# Patient Record
Sex: Male | Born: 1960 | Race: White | Hispanic: No | Marital: Married | Smoking: Never smoker
Health system: Southern US, Community
[De-identification: ages and names within clinical notes are randomized; demographics above are authoritative.]

## PROBLEM LIST (undated history)

## (undated) DIAGNOSIS — D126 Benign neoplasm of colon, unspecified: Secondary | ICD-10-CM

## (undated) DIAGNOSIS — K2210 Ulcer of esophagus without bleeding: Secondary | ICD-10-CM

## (undated) DIAGNOSIS — K222 Esophageal obstruction: Secondary | ICD-10-CM

## (undated) DIAGNOSIS — K449 Diaphragmatic hernia without obstruction or gangrene: Secondary | ICD-10-CM

## (undated) DIAGNOSIS — D869 Sarcoidosis, unspecified: Secondary | ICD-10-CM

## (undated) DIAGNOSIS — K5790 Diverticulosis of intestine, part unspecified, without perforation or abscess without bleeding: Secondary | ICD-10-CM

## (undated) DIAGNOSIS — K219 Gastro-esophageal reflux disease without esophagitis: Secondary | ICD-10-CM

## (undated) DIAGNOSIS — R079 Chest pain, unspecified: Secondary | ICD-10-CM

## (undated) DIAGNOSIS — M1990 Unspecified osteoarthritis, unspecified site: Secondary | ICD-10-CM

## (undated) DIAGNOSIS — I10 Essential (primary) hypertension: Secondary | ICD-10-CM

## (undated) HISTORY — DX: Unspecified osteoarthritis, unspecified site: M19.90

## (undated) HISTORY — DX: Ulcer of esophagus without bleeding: K22.10

## (undated) HISTORY — DX: Diverticulosis of intestine, part unspecified, without perforation or abscess without bleeding: K57.90

## (undated) HISTORY — DX: Esophageal obstruction: K22.2

## (undated) HISTORY — DX: Gastro-esophageal reflux disease without esophagitis: K21.9

## (undated) HISTORY — PX: COLONOSCOPY: SHX174

## (undated) HISTORY — PX: UPPER GASTROINTESTINAL ENDOSCOPY: SHX188

## (undated) HISTORY — DX: Sarcoidosis, unspecified: D86.9

## (undated) HISTORY — DX: Benign neoplasm of colon, unspecified: D12.6

## (undated) HISTORY — PX: TONSILLECTOMY AND ADENOIDECTOMY: SUR1326

## (undated) HISTORY — PX: HERNIA REPAIR: SHX51

## (undated) HISTORY — DX: Diaphragmatic hernia without obstruction or gangrene: K44.9

## (undated) HISTORY — DX: Essential (primary) hypertension: I10

---

## 1998-03-25 ENCOUNTER — Ambulatory Visit (HOSPITAL_COMMUNITY): Admission: RE | Admit: 1998-03-25 | Discharge: 1998-03-25 | Payer: Self-pay | Admitting: Family Medicine

## 1998-03-25 ENCOUNTER — Encounter: Payer: Self-pay | Admitting: Family Medicine

## 2001-05-22 ENCOUNTER — Encounter: Admission: RE | Admit: 2001-05-22 | Discharge: 2001-05-22 | Payer: Self-pay | Admitting: General Surgery

## 2001-05-22 ENCOUNTER — Encounter: Payer: Self-pay | Admitting: General Surgery

## 2003-08-29 DIAGNOSIS — D126 Benign neoplasm of colon, unspecified: Secondary | ICD-10-CM

## 2003-08-29 HISTORY — DX: Benign neoplasm of colon, unspecified: D12.6

## 2003-09-05 ENCOUNTER — Encounter: Payer: Self-pay | Admitting: Gastroenterology

## 2005-12-20 ENCOUNTER — Ambulatory Visit: Payer: Self-pay | Admitting: Gastroenterology

## 2005-12-22 ENCOUNTER — Encounter: Payer: Self-pay | Admitting: Gastroenterology

## 2005-12-22 ENCOUNTER — Ambulatory Visit (HOSPITAL_COMMUNITY): Admission: RE | Admit: 2005-12-22 | Discharge: 2005-12-22 | Payer: Self-pay | Admitting: Gastroenterology

## 2005-12-22 ENCOUNTER — Encounter (INDEPENDENT_AMBULATORY_CARE_PROVIDER_SITE_OTHER): Payer: Self-pay | Admitting: *Deleted

## 2005-12-23 ENCOUNTER — Ambulatory Visit: Payer: Self-pay | Admitting: Gastroenterology

## 2006-01-17 ENCOUNTER — Ambulatory Visit (HOSPITAL_COMMUNITY): Admission: RE | Admit: 2006-01-17 | Discharge: 2006-01-17 | Payer: Self-pay | Admitting: Gastroenterology

## 2006-01-17 ENCOUNTER — Encounter: Payer: Self-pay | Admitting: Gastroenterology

## 2006-01-23 ENCOUNTER — Ambulatory Visit: Payer: Self-pay | Admitting: Gastroenterology

## 2006-05-17 ENCOUNTER — Ambulatory Visit: Payer: Self-pay | Admitting: Gastroenterology

## 2009-04-27 ENCOUNTER — Encounter (INDEPENDENT_AMBULATORY_CARE_PROVIDER_SITE_OTHER): Payer: Self-pay | Admitting: *Deleted

## 2009-04-27 ENCOUNTER — Ambulatory Visit: Payer: Self-pay | Admitting: Gastroenterology

## 2009-04-27 DIAGNOSIS — Z860100 Personal history of colon polyps, unspecified: Secondary | ICD-10-CM | POA: Insufficient documentation

## 2009-04-27 DIAGNOSIS — R1319 Other dysphagia: Secondary | ICD-10-CM | POA: Insufficient documentation

## 2009-04-27 DIAGNOSIS — K2100 Gastro-esophageal reflux disease with esophagitis, without bleeding: Secondary | ICD-10-CM | POA: Insufficient documentation

## 2009-05-01 ENCOUNTER — Encounter: Payer: Self-pay | Admitting: Gastroenterology

## 2009-07-07 ENCOUNTER — Ambulatory Visit: Payer: Self-pay | Admitting: Gastroenterology

## 2009-07-08 ENCOUNTER — Encounter: Payer: Self-pay | Admitting: Gastroenterology

## 2009-08-07 ENCOUNTER — Encounter (INDEPENDENT_AMBULATORY_CARE_PROVIDER_SITE_OTHER): Payer: Self-pay | Admitting: *Deleted

## 2009-08-07 ENCOUNTER — Ambulatory Visit: Payer: Self-pay | Admitting: Gastroenterology

## 2009-08-27 ENCOUNTER — Ambulatory Visit: Payer: Self-pay | Admitting: Gastroenterology

## 2009-09-23 ENCOUNTER — Encounter (INDEPENDENT_AMBULATORY_CARE_PROVIDER_SITE_OTHER): Payer: Self-pay | Admitting: *Deleted

## 2009-09-25 ENCOUNTER — Ambulatory Visit: Payer: Self-pay | Admitting: Gastroenterology

## 2009-10-22 ENCOUNTER — Ambulatory Visit: Payer: Self-pay | Admitting: Gastroenterology

## 2009-10-23 ENCOUNTER — Telehealth (INDEPENDENT_AMBULATORY_CARE_PROVIDER_SITE_OTHER): Payer: Self-pay

## 2009-10-23 DIAGNOSIS — K222 Esophageal obstruction: Secondary | ICD-10-CM | POA: Insufficient documentation

## 2009-10-27 ENCOUNTER — Encounter: Payer: Self-pay | Admitting: Gastroenterology

## 2009-10-28 ENCOUNTER — Encounter: Payer: Self-pay | Admitting: Gastroenterology

## 2009-11-25 ENCOUNTER — Ambulatory Visit: Payer: Self-pay | Admitting: Gastroenterology

## 2010-05-25 ENCOUNTER — Encounter: Payer: Self-pay | Admitting: Gastroenterology

## 2010-06-29 NOTE — Miscellaneous (Signed)
Summary: RX prevacid  Clinical Lists Changes  Medications: Added new medication of PREVACID SOLUTAB 30 MG TBDP (LANSOPRAZOLE) 1 solutab dissolve under tongue every morning - Signed Rx of PREVACID SOLUTAB 30 MG TBDP (LANSOPRAZOLE) 1 solutab dissolve under tongue every morning;  #34 x 11;  Signed;  Entered by: Sherren Kerns RN;  Authorized by: Meryl Dare MD [facility];  Method used: [id number] [address] [place], [place]  [address], Ph: [phone] or [phone], Fax: [phone] Observations: Added new observation of ALLERGY REV: Done (10/22/2009 15:48)    Prescriptions: PREVACID SOLUTAB 30 MG TBDP (LANSOPRAZOLE) 1 solutab dissolve under tongue every morning  #34 x 11   Entered by:   Sherren Kerns RN   Authorized by:   Meryl Dare MD [facility]   Signed by:   Sherren Kerns RN on 10/22/2009   Method used:   [facility]        [phone][facility] (retail)       [address]       [place], [place]  [address]       Ph: [phone] or [phone]       Fax: [phone]   RxID:   [id number]

## 2010-06-29 NOTE — Procedures (Signed)
Summary: Upper Endoscopy w/DIL  Patient: [name] Note: All result statuses are Final unless otherwise noted.  Tests: (1) Upper Endoscopy w/DIL (UED)  UED Upper Endoscopy w/DIL                             DONE     [facility]     [address]     [place], [place]  [address]           ENDOSCOPY PROCEDURE REPORT           PATIENT:  Alexander, [name]  MR#:  [id number]     BIRTHDATE:  [birth year], 48 yrs. old  GENDER:  male           ENDOSCOPIST:  Judie Petit T. Russella Dar, MD, [facility]           PROCEDURE DATE:  07/07/2009     PROCEDURE:  EGD with dilatation over guidewire, and with biopsy     ASA CLASS:  Class II     INDICATIONS:  1) dysphagia           MEDICATIONS:  There was residual sedation effect present from     prior procedure, Fentanyl 25 mcg IV, Versed 4 mg IV     TOPICAL ANESTHETIC:  Exactacain Spray           DESCRIPTION OF PROCEDURE:   After the risks benefits and     alternatives of the procedure were thoroughly explained, informed     consent was obtained.  The LB-GIF-H180 [id number] endoscope was     introduced through the mouth and advanced to the second portion of     the duodenum, without limitations.  The instrument was slowly     withdrawn as the mucosa was carefully examined.     <<PROCEDUREIMAGES>>           A stricture was found in the distal esophagus. It was     circumferential and benign appearing. It was 9 mm in size.     Multiple biopsies were obtained and sent to pathology. The     endoscope could not easily pass the stricture and a dilation was     performed prior to evaluation of the stomach and duodenum. The     endoscope passed the stricture without difficulty after the     dilation. The stomach was entered and closely examined. The     pylorus, antrum, angularis, and lesser curvature were well     visualized, including a retroflexed view of the cardia and fundus.     The stomach wall was normally distensable. The scope passed easily     through the  pylorus into the duodenum. The duodenal bulb was     normal in appearance, as was the postbulbar duodenum. A 3 cm     hiatal hernia was found. Dilation was then performed at the distal     esophagus:           1) Dilator:  Savary over guidewire  Size:  13 mm     Resistance:  moderate  Heme:  yes           COMPLICATIONS:  None           ENDOSCOPIC IMPRESSION:     1) Stricture in the distal esophagus     2) Hiatal hernia           RECOMMENDATIONS:     1)  await pathology results     2) anti-reflux regimen     3) continue PPI long term     4) post dilation instructions     5) Repeat EGD with Savary in 3-4 weeks           Vici Novick T. Russella Dar, MD, [facility]           CC:  Holley Bouche, M.D.           n.     eSIGNED:   Venita Lick. Dakota Stangl at 07/07/2009 11:57 AM           [name], [id number]  Note: An exclamation mark (!) indicates a result that was not dispersed into the flowsheet. Document Creation Date: 07/07/2009 11:58 AM _______________________________________________________________________  (1) Order result status: Final Collection or observation date-time: 07/07/2009 11:50 Requested date-time:  Receipt date-time:  Reported date-time:  Referring Physician:   Ordering Physician: Claudette Head [phone]) Specimen Source:  Source: [name] Order Number: [id number] Lab site:

## 2010-06-29 NOTE — Assessment & Plan Note (Signed)
Summary: consult before w ecl difficulty swallowing./..em   History of Present Illness Visit Type: Initial Visit Primary GI MD: Elie Goody [facility] Primary Provider: Holley Bouche, MD Chief Complaint: Patient c/o recurrent dysphagia to solid foods. Patient is also due for a recall colonoscopy. History of Present Illness:   [name] returns today for recurrent problems with solid food dysphagia. He has history of an esophageal stricture dilated twice in 2007. Erosive esophagitis was noted. He was recommended to stay on a proton pump inhibitor, long-term, but has not been on one for the past 2 years. He has occasional heartburn, but has significant problems with solid food dysphagia for the past 2 months and has had to induce vomiting on a few occasions. He is also due for followup of adenomatous colon polyps. He has no colorectal symptoms.   GI Review of Systems    Reports dysphagia with solids and  heartburn.      Denies abdominal pain, acid reflux, belching, bloating, chest pain, dysphagia with liquids, loss of appetite, nausea, vomiting, vomiting blood, weight loss, and  weight gain.        Denies anal fissure, black tarry stools, change in bowel habit, constipation, diarrhea, diverticulosis, fecal incontinence, heme positive stool, hemorrhoids, irritable bowel syndrome, jaundice, light color stool, liver problems, rectal bleeding, and  rectal pain. Preventive Screening-Counseling & Management  Alcohol-Tobacco     Smoking Status: never      Drug Use:  no.     Current Medications (verified): 1)  "hypertension Medication" (Unknown Name/dosage) .... Take 1 Tablet By Mouth Once A Day  Allergies (verified): No Known Drug Allergies  Past History:  Past Medical History: Sarcoidosis Esophageal Stricture 2007 Erosive esophagitis. 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  Alcohol Use - no Daily Caffeine Use Illicit Drug Use - no Smoking Status:  never Drug Use:  no  Review of Systems       The pertinent positives and negatives are noted as above and in the HPI. All other ROS were reviewed and were negative.   Vital Signs:  Patient profile:   50 year old male Height:      74 inches Weight:      218.25 pounds BMI:     28.12 BSA:     2.26 Pulse rate:   88 / minute Pulse rhythm:   regular BP sitting:   110 / 70  (right arm)  Vitals Entered By: Hortense Ramal CMA Duncan Dull) (April 27, 2009 2:44 PM)  Physical Exam  General:  Well developed, well nourished, no acute distress. Head:  Normocephalic and atraumatic. Eyes:  PERRLA, no icterus. Ears:  Normal auditory acuity. Mouth:  No deformity or lesions, dentition normal. Lungs:  Clear throughout to auscultation. Heart:  Regular rate and rhythm; no murmurs, rubs,  or bruits. Abdomen:  Soft, nontender and nondistended. No masses, hepatosplenomegaly or hernias noted. Normal bowel sounds. Rectal:  deferred until time of colonoscopy.   Msk:  Symmetrical with no gross deformities. Normal posture. Pulses:  Normal pulses noted. Extremities:  No clubbing, cyanosis, edema or deformities noted. Neurologic:  Alert and  oriented x4;  grossly normal neurologically. Cervical Nodes:  No significant cervical adenopathy. Inguinal Nodes:  No significant inguinal adenopathy. Psych:  Alert and cooperative. Normal mood and affect.   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 I reiterated the need for long-term proton pump inhibitor therapy to decrease the chance of a recurrent peptic stricture. Begin Prevacid  SolTab 30 mg q.a.m. long term with standard antireflux measures. Patient requests a medication that is chewable or dissolves in his mouth.  The risks, benefits and alternatives to endoscopy with possible biopsy and possible dilation were discussed with the patient and they consent to proceed. The procedure will be scheduled electively. Orders: Colon/Endo (Colon/Endo)  Problem # 2:  COLONIC POLYPS, HX OF (ICD-V12.72) Personal history of tubulovillous adenomatous colon polyps due for surveillance colonoscopy. Orders: Colon/Endo (Colon/Endo)  Patient Instructions: 1)  Colonoscopy brochure given.  2)  Conscious Sedation brochure given.  3)  Upper Endoscopy brochure given.  4)  Avoid foods high in acid content ( tomatoes, citrus juices, spicy foods) . Avoid eating within 3 to 4 hours of lying down or before exercising. Do not over eat; try smaller more frequent meals. Elevate head of bed four inches when sleeping.  5)  Copy sent to : Holley Bouche, MD 6)  The medication list was reviewed and reconciled.  All changed / newly prescribed medications were explained.  A complete medication list was provided to the patient / caregiver.  Prescriptions: PREVACID SOLUTAB 30 MG TBDP (LANSOPRAZOLE) one tablet by mouth dissolved under tongue once daily  #34 x 11   Entered by:   Christie Nottingham CMA (AAMA)   Authorized by:   Meryl Dare MD [facility]   Signed by:   Meryl Dare MD FACG on 04/27/2009   Method used:   [facility]        [phone] ([facility]ail[facility]       [address]       [place], [place]  [address]       Ph: [phone] or [phone]       Fax: [phone]   RxID:   [id number] MOVIPREP 100 GM  SOLR (PEG-KCL-NACL-NASULF-NA ASC-C) As per prep instructions.  #1 x 0   Entered by:   Christie Nottingham CMA (AAMA)   Authorized by:   Meryl Dare MD [facility]   Signed by:   Meryl Dare MD FACG on 04/27/2009   Method used:   [facility] to        [facility] [phone][facility]  (retail)       [address]       [place], [place]  [address]       Ph: [phone] or [phone]       Fax: [phone]   RxID:   [id number]

## 2010-06-29 NOTE — Procedures (Signed)
Summary: Colonoscopy  Patient: Alexander [name] Note: All result statuses are Final unless otherwise noted.  Tests: (1) Colonoscopy (COL)   COL Colonoscopy           DONE     [facility]     [address]     [place], [place]  [address]           COLONOSCOPY PROCEDURE REPORT           PATIENT:  [name], Alexander  MR#:  [id number]     BIRTHDATE:  1960/07/24, 48 yrs. old  GENDER:  male           ENDOSCOPIST:  Judie Petit T. Russella Dar, MD, [facility]           PROCEDURE DATE:  07/07/2009     PROCEDURE:  Colonoscopy with biopsy and snare polypectomy     ASA CLASS:  Class II     INDICATIONS:  1) follow-up of polyp, tubulovillous adenomatous     polyp, 08/2003.           MEDICATIONS:   Fentanyl 100 mcg IV, Versed 8 mg IV           DESCRIPTION OF PROCEDURE:   After the risks benefits and     alternatives of the procedure were thoroughly explained, informed     consent was obtained.  Digital rectal exam was performed and     revealed no abnormalities.   The LB PCF-H180AL [id number] endoscope     was introduced through the anus and advanced to the cecum, which     was identified by both the appendix and ileocecal valve, without     limitations.  The quality of the prep was good, using MoviPrep.     The instrument was then slowly withdrawn as the colon was fully     examined.     <<PROCEDUREIMAGES>>           FINDINGS:  A sessile polyp was found in the mid transverse colon.     It was 7 mm in size. Polyp was snared, then cauterized with     monopolar cautery. Retrieval was successful. A sessile polyp was     found in the mid transverse colon. It was 4 mm in size. Polyp was     snared without cautery. Retrieval was successful. Two polyps were     found in the sigmoid colon. They were 3 mm in size. The polyps     were removed using cold biopsy forceps.  Moderate diverticulosis     was found in the sigmoid colon. This was otherwise a normal     examination of the colon. Retroflexed views in the rectum  revealed     no abnormalities. The time to cecum =  1.75  minutes. The scope     was then withdrawn (time =  13  min) from the patient and the     procedure completed.           COMPLICATIONS:  None           ENDOSCOPIC IMPRESSION:     1) 7 mm sessile polyp in the mid transverse colon     2) 4 mm sessile polyp in the mid transverse colon     3) 3 mm Two polyps in the sigmoid colon     4) Moderate diverticulosis in the sigmoid colon           RECOMMENDATIONS:     1) No  aspirin or NSAID's for 2 weeks     2) Await pathology results     3) High fiber diet     4) Repeat Colonoscopy in 3 years.           Venita Lick. Russella Dar, MD, [facility]           CC: Holley Bouche, MD           n.     Rosalie DoctorVenita Lick. Denisia Harpole at 07/07/2009 11:39 AM           [name], [id number]  Note: An exclamation mark (!) indicates a result that was not dispersed into the flowsheet. Document Creation Date: 07/07/2009 11:40 AM _______________________________________________________________________  (1) Order result status: Final Collection or observation date-time: 07/07/2009 11:35 Requested date-time:  Receipt date-time:  Reported date-time:  Referring Physician:   Ordering Physician: Claudette Head [phone]) Specimen Source:  Source: [name] Order Number: [id number] Lab site:   Appended Document: Colonoscopy     Procedures Next Due Date:    Colonoscopy: 06/2012

## 2010-06-29 NOTE — Letter (Signed)
Summary: Patient [facility] Biopsy Results  [facility]  [address] [place], [place] [address]   Phone: [phone]  Fax: [phone]        July 08, 2009 MRN: [id number]    [facility] [address] [place], [place]  [address]    Dear Alexander [name],  I am pleased to inform you that the biopsies taken during your recent endoscopic examination did not show any evidence of cancer upon pathologic examination. The stricture biopsies were unremarkable.  Additional information/recommendations: Continue with the treatment plan as outlined on the day of your      exam.  You should have a repeat endoscopic examination for this problem              in 3 weeks.  Please call us if you are having persistent problems or have questions about your condition that have not been fully answered at this time.  Sincerely,  Meryl Dare MD [facility]  This letter has been electronically signed by your physician.  Appended Document: Patient Notice-Endo Biopsy Results letter mailed 2.11.11

## 2010-06-29 NOTE — Procedures (Signed)
Summary: Upper Endoscopy w/DIL  Patient: [name] Note: All result statuses are Final unless otherwise noted.  Tests: (1) Upper Endoscopy w/DIL (UED)  UED Upper Endoscopy w/DIL                             DONE     [facility]     [address]     [place], [place]  [address]           ENDOSCOPY PROCEDURE REPORT           PATIENT:  Alexander [name], Alexander [name]  MR#:  [id number]     BIRTHDATE:  08-Apr-1961, 48 yrs. old  GENDER:  male           ENDOSCOPIST:  Judie Petit T. Russella Dar, MD, [facility]           PROCEDURE DATE:  08/27/2009     PROCEDURE:  EGD with dilatation over guidewire     ASA CLASS:  Class II     INDICATIONS:  1) dysphagia  2) dilation of esophageal stricture           MEDICATIONS:  Fentanyl 75 mcg IV, Versed 10 mg IV     TOPICAL ANESTHETIC:  Exactacain Spray           DESCRIPTION OF PROCEDURE:   After the risks benefits and     alternatives of the procedure were thoroughly explained, informed     consent was obtained.  The LB-GIF-H180 [id number] endoscope was     introduced through the mouth and advanced to the second portion of     the duodenum, without limitations.  The instrument was slowly     withdrawn as the mucosa was carefully examined.     <<PROCEDUREIMAGES>>           A stricture was found in the distal esophagus. It was     circumferential. It was 11 mm in diameter.  Duodenitis was found     in the bulb of the duodenum. It was erythematous without erosions.     The stomach was entered and closely examined. The pyolrus, antrum,     angularis, and lesser curvature were well visualized, including a     retroflexed view of the cardia and fundus. The stomach wall was     normally distensable. The scope passed easily through the pylorus     into the duodenum.  A hiatal hernia was found in the cardia,     small.  Dilation was then performed at the distal esophagus:           1) Dilator:  Savary over guidewire  Sizes:  13, 14, 15 mm     Resistance:  minimal  Heme:  yes       COMPLICATIONS:  None           ENDOSCOPIC IMPRESSION:     1) 11 mm stricture in the distal esophagus     2) Duodenitis     3) Hiatal hernia           RECOMMENDATIONS:     1) anti-reflux regimen     2) continue PPI     3) endoscopy with Savary in 3-4 weeks           Candas Deemer T. Russella Dar, MD, [facility]           CC:  Holley Bouche, M.D.  n.     eSIGNED:   Deon Ivey T. Aydeen Blume at 08/27/2009 11:05 AM           [name], [id number]  Note: An exclamation mark (!) indicates a result that was not dispersed into the flowsheet. Document Creation Date: 08/27/2009 11:06 AM _______________________________________________________________________  (1) Order result status: Final Collection or observation date-time: 08/27/2009 11:00 Requested date-time:  Receipt date-time:  Reported date-time:  Referring Physician:   Ordering Physician: Claudette Head [phone]) Specimen Source:  Source: [name] Order Number: [id number] Lab site:

## 2010-06-29 NOTE — Procedures (Signed)
Summary: Upper Endoscopy w/DIL  Patient: Alexander [name] Note: All result statuses are Final unless otherwise noted.  Tests: (1) Upper Endoscopy w/DIL (UED)  UED Upper Endoscopy w/DIL                             DONE     [facility]     [address]     [place], [place]  [address]           ENDOSCOPY PROCEDURE REPORT           PATIENT:  Mustafa, Potts  MR#:  [id number]     BIRTHDATE:  November 21, 1960, 48 yrs. old  GENDER:  male     ENDOSCOPIST:  Judie Petit T. Russella Dar, MD, [facility]           PROCEDURE DATE:  10/22/2009     PROCEDURE:  EGD with dilatation over guidewire     ASA CLASS:  Class II     INDICATIONS:  1) stricture  2) dilation of esophageal stricture     MEDICATIONS:  Fentanyl 100 mcg IV, Versed 9 mg IV     TOPICAL ANESTHETIC:  Exactacain Spray     DESCRIPTION OF PROCEDURE:   After the risks benefits and     alternatives of the procedure were thoroughly explained, informed     consent was obtained.  The LB-GIF-H180 [id number] endoscope was     introduced through the mouth and advanced to the second portion of     the duodenum, without limitations.  The instrument was slowly     withdrawn as the mucosa was carefully examined.     <<PROCEDUREIMAGES>>     A stricture was found at the gastroesophageal junction. It was     erosive and circumferential. It was 12 mm in diametet.  Mild     gastritis was found in the antrum. It was erosive and     erythematous.  Duodenitis was found. It was erythematous and     erosive.  The examination was otherwise normal.  A small hiatal     hernia was found. Dilation was then performed at the     gastroesphageal junction:           1) Dilator:  Savary over guidewire  Size:  14mm     Resistance:  none  Heme:  yes, minimal           2) Dilator:  Savary over guidewire  Size:  15mm     Resistance:  minimal  Heme:  yes, minimal           3) Dilator:  Savary over guidewire  Size(s):  16     Resistance:  minimal  Heme:  yes, moderate        COMPLICATIONS:  None           ENDOSCOPIC IMPRESSION:     1) 12 mm stricture at the gastroesophageal junction     2) Mild antral gastritis     3) Duodenitis     4) Hiatal hernia           RECOMMENDATIONS:     1) anti-reflux regimen     2) continue PPI     3) post dilation instructions     4) endoscopy/savary dilation 2-4 weeks           Garlin Batdorf T. Russella Dar, MD, [facility]           CC:  Holley Bouche, M.D.           n.     eSIGNED:   Venita Lick. Nikitha Mode at 10/22/2009 03:30 PM           Teng, Decou, [id number]  Note: An exclamation mark (!) indicates a result that was not dispersed into the flowsheet. Document Creation Date: 10/22/2009 3:31 PM _______________________________________________________________________  (1) Order result status: Final Collection or observation date-time: 10/22/2009 15:23 Requested date-time:  Receipt date-time:  Reported date-time:  Referring Physician:   Ordering Physician: Claudette Head [phone]) Specimen Source:  Source: [name] Order Number: [id number] Lab site:

## 2010-06-29 NOTE — Letter (Signed)
Summary: Patient Notice-Hyperplastic Polyps  [facility]  [address] [place], [place] [address]   Phone: [phone]  Fax: [phone]        July 08, 2009 MRN: [id number]    [facility] [address] [place], [place]  [address]    Dear Mr. Biondo,  I am pleased to inform you that the colon polyp(s) removed during your recent colonoscopy was (were) found to be hyperplastic. These types of polyps are NOT pre-cancerous.  It is my recommendation that you have a repeat colonoscopy examination in 3 years.  Should you develop new or worsening symptoms of abdominal pain, bowel habit changes or bleeding from the rectum or bowels, please schedule an evaluation with either your primary care physician or with me.  Continue treatment plan as outlined the day of your exam.  Please call us if you are having persistent problems or have questions about your condition that have not been fully answered at this time.  Sincerely,  Meryl Dare MD [facility]  his letter has been electronically signed by your physician.  Appended Document: Patient Notice-Hyperplastic Polyps letter mailed 2.11.11

## 2010-06-29 NOTE — Procedures (Signed)
Summary: EGD and biopsy   EGD  Procedure date:  12/22/2005  Findings:      Findings: Stricture:  Location: [facility]    Procedures Next Due Date:    EGD: 01/2006  EGD  Procedure date:  12/22/2005  Findings:      Findings: Stricture:  Location: [facility]    Procedures Next Due Date:    EGD: 01/2006 Patient Name: Alexander [name], Alexander [name] MRN: [id number] Procedure Procedures: Panendoscopy (EGD) CPT: 43235.    with biopsy(s)/brushing(s). CPT: D1846139.    with esophageal dilation. CPT: G9296129.    with fluoroscopy for esoph stricture, PCT: [id number] Personnel: Endoscopist: Venita Lick. Russella Dar, MD, Clementeen Graham.  Exam Location: Exam performed in Endoscopy Suite. Outpatient  Patient Consent: Procedure, Alternatives, Risks and Benefits discussed, consent obtained, from patient. Consent was obtained by the RN.  Indications  Therapeutics: Reason for exam: Esophageal dilation.  Symptoms: Dysphagia.  History  Current Medications: Patient is not currently taking Coumadin.  Pre-Exam Physical: Performed Sep 05, 2003  Cardio-pulmonary exam, HEENT exam, Abdominal exam, Mental status exam WNL.  Comments: Pt. history reviewed/updated, physical exam performed prior to initiation of sedation?Yes Exam Exam Info: Maximum depth of insertion Duodenum, intended Duodenum. Vocal cords not visualized. Gastric retroflexion performed. ASA Classification: II. Tolerance: excellent.  Sedation Meds: Patient assessed and found to be appropriate for moderate (conscious) sedation. Fentanyl 100 mcg. given IV. Versed 10 mg. given IV. Cetacaine Spray 2 sprays given aerosolized.  Monitoring: BP and pulse monitoring done. Oximetry used. Supplemental O2 given  Findings Normal: Proximal Esophagus to Mid Esophagus.  STRICTURE / STENOSIS: Distal Esophagus.  Constriction: partial. Lumen diameter is 9 mm. Biopsy of Stricture/Steno  taken. ICD9: Esophageal Stricture: 530.3. Comment: only able to pass the  stricture post dilation.  - Dilation: Distal Esophagus. Procedure was performed under Fluoroscopy. Savary dilator used, Diameter: 9 mm, No Resistance, Minimal Heme present on extraction. Savary dilator used, Diameter: 10 mm, Minimal Resistance, Minimal Heme present on extraction. Savary dilator used, Diameter: 11 mm, Minimal Resistance, Minimal Heme present on extraction. Savary dilator used, Diameter: 12 mm, Minimal Resistance, Minimal Heme present on extraction. Patient tolerance excellent. Outcome: successful.  HIATAL HERNIA: 5 cms. in length. ICD9: Hernia, Hiatal: 553.3. Normal: Fundus to Duodenal 2nd Portion.   Assessment  Diagnoses: 530.3: Esophageal Stricture.  553.3: Hernia, Hiatal.   Events  Unplanned Intervention: No unplanned interventions were required.  Unplanned Events: There were no complications. Plans Instructions: Nothing to eat or drink for 1 hour.  Clear or full liquids: 1 hour. No aspirin or non-steroidal containing medications: 1 week.  Medication(s): Await pathology. PPI: QAM, for indefinitely.   Patient Education: Patient given standard instructions for: Hiatal Hernia. Stenosis / Stricture. Varices.  Disposition: After procedure patient sent to recovery. After recovery patient sent home.  Scheduling: EGD, to [employer]. Russella Dar, MD, Clementeen Graham, savary with fluoro-[facility] around Jan 16, 2006.    This report was created from the original endoscopy report, which was reviewed and signed by the above listed endoscopist.    cc: Holley Bouche, MD       SP Surgical Pathology - STATUS: Final             By: Osie Bond  ,      Perform Date: 26Jul07 00:01  Ordered By: Rica Records Date: 26Jul07 16:49  Facility: [facility]  Department: CPATH  Service Report Text  [facility]   [address]   [place], [place] [address]   [phone]    REPORT OF SURGICAL  PATHOLOGY    Case #: [id number]   Patient Name: Alexander [name], Alexander [name].   Office Chart Number: N/A    MRN: [id number]   Pathologist: Renato Battles, M.D.   DOB/Age 09-18-1960 (Age: 50) Gender: M   Date Taken: 12/22/2005   Date Received: 12/22/2005    FINAL DIAGNOSIS    ***MICROSCOPIC EXAMINATION AND DIAGNOSIS***    DISTAL ESOPHAGUS, BIOPSY: GASTROESOPHAGEAL JUNCTION MUCOSA WITH   MILD INFLAMMATION CONSISTENT WITH GASTROESOPHAGEAL REFLUX. NO   INTESTINAL METAPLASIA, DYSPLASIA OR MALIGNANCY IDENTIFIED.    COMMENT   An Alcian Blue stain is performed to determine the presence of   intestinal metaplasia (goblet cell metaplasia). No intestinal   metaplasia (goblet cell metaplasia) is identified with the Alcian   Blue stain. The control stained appropriately. (MS:mj 12/23/05)    mj   Date Reported: 12/23/2005 Renato Battles, M.D.   *** Electronically Signed Out By MS ***    Clinical information   R/O peptic stricture (as)    specimen(s) obtained   Esophagus, biopsy, distal stricture    Gross Description   Received in formalin are tan, soft tissue fragments that are   submitted in toto. Number: two.   Size: each 0.2 cm SW:mw 12/23/05    mw/

## 2010-06-29 NOTE — Miscellaneous (Signed)
Summary: [facility]  Clinical Lists Changes  Observations: Added new observation of NKA: T (09/25/2009 15:58)

## 2010-06-29 NOTE — Medication Information (Signed)
Summary: Lansoprazole/[employer]  Lansoprazole/[employer]   Imported By: Sherian Rein 11/02/2009 14:34:17  _____________________________________________________________________  External Attachment:    Type:   Image     Comment:   External Document

## 2010-06-29 NOTE — Letter (Signed)
Summary: EGD Instructions  [facility]  [address] [place], [place] [address]   Phone: [phone]  Fax: [phone]       Alexander [name]    1960/12/12    MRN: [id number]       Procedure Day Dorna Bloom: Wednesday 11-25-09     Arrival Time: 12:30      Procedure Time: 1:30     Location of Procedure:                    [facility] (4th Floor)  PREPARATION FOR ENDOSCOPY   On Wednesday 11-25-09  THE DAY OF THE PROCEDURE:  1.   No solid foods, milk or milk products are allowed after midnight the night before your procedure.  2.   Do not drink anything colored red or purple.  Avoid juices with pulp.  No orange juice.  3.  You may drink clear liquids until 11:30 , which is 2 hours before your procedure.                                                                                                CLEAR LIQUIDS INCLUDE: Water Jello Ice Popsicles Tea (sugar ok, no milk/cream) Powdered fruit flavored drinks Coffee (sugar ok, no milk/cream) Gatorade Juice: apple, white grape, white cranberry  Lemonade Clear bullion, consomm, broth Carbonated beverages (any kind) Strained chicken noodle soup Hard Candy   MEDICATION INSTRUCTIONS  Unless otherwise instructed, you should take regular prescription medications with a small sip of water as early as possible the morning of your procedure.  Diabetic patients - see separate instructions.           OTHER INSTRUCTIONS  You will need a responsible adult at least 50 years of age to accompany you and drive you home.   This person must remain in the waiting room during your procedure.  Wear loose fitting clothing that is easily removed.  Leave jewelry and other valuables at home.  However, you may wish to bring a book to read or an iPod/MP3 player to listen to music as you wait for your procedure to start.  Remove all body piercing jewelry and leave at home.  Total time from sign-in until discharge is  approximately 2-3 hours.  You should go home directly after your procedure and rest.  You can resume normal activities the day after your procedure.  The day of your procedure you should not:   Drive   Make legal decisions   Operate machinery   Drink alcohol   Return to work  You will receive specific instructions about eating, activities and medications before you leave.    The above instructions have been reviewed and explained to me by   _______________________    I fully understand and can verbalize these instructions _____________________________ Date _________

## 2010-06-29 NOTE — Procedures (Signed)
Summary: Upper Endoscopy w/DIL  Patient: Alexander [name] Note: All result statuses are Final unless otherwise noted.  Tests: (1) Upper Endoscopy w/DIL (UED)  UED Upper Endoscopy w/DIL                             DONE     [facility]     [address]     [place], [place]  [address]           ENDOSCOPY PROCEDURE REPORT           PATIENT:  Alexander, [name]  MR#:  [id number]     BIRTHDATE:  [birth year], 48 yrs. old  GENDER:  male     ENDOSCOPIST:  Judie Petit T. Russella Dar, MD, [facility]           PROCEDURE DATE:  11/25/2009     PROCEDURE:  EGD with dilatation over guidewire     ASA CLASS:  Class II     INDICATIONS:  1) stricture  2) dilation of esophageal stricture     MEDICATIONS:  Fentanyl 75 mcg IV, Versed 8 mg IV     TOPICAL ANESTHETIC:  Exactacain Spray     DESCRIPTION OF PROCEDURE:   After the risks benefits and     alternatives of the procedure were thoroughly explained, informed     consent was obtained.  The [id number] endoscope was     introduced through the mouth and advanced to the second portion of     the duodenum, without limitations.  The instrument was slowly     withdrawn as the mucosa was carefully examined.     <<PROCEDUREIMAGES>>     A stricture was found at the gastroesophageal junction. It was     circumferential and benign appearing. It was 14 mm in diameter.  A     hiatal hernia was found. It was small.  The esophagus was     completely normal in appearance.  The stomach was entered and     closely examined. The pylorus, antrum, angularis, and lesser     curvature were well visualized, including a retroflexed view of     the cardia and fundus. The stomach wall was normally distensable.     The scope passed easily through the pylorus into the duodenum.     The duodenal bulb was normal in appearance, as was the postbulbar     duodenum.  Dilation was then performed at the gastroesphageal     junction           1) Dilator:  Savary over guidewire  Sizes:  15 mm,  16 mm, 17 mm     Resistance:  minimal  Heme:  none on the first two and a trace on     the last dilator           COMPLICATIONS:  None           ENDOSCOPIC IMPRESSION:     1) 14 mm stricture at the gastroesophageal junction     2) Small hiatal hernia           RECOMMENDATIONS:     1) anti-reflux regimen long term     2) continue PPI qam long term     3) post dilation instructions     4) follow-up: GI clinic 12 months           Philipp Callegari T. Russella Dar, MD, Clementeen Graham  CC:  Holley Bouche, M.D.           n.     eSIGNED:   Venita Lick. Danniel Grenz at 11/25/2009 01:55 PM           [place], [place], [id number]  Note: An exclamation mark (!) indicates a result that was not dispersed into the flowsheet. Document Creation Date: 11/25/2009 1:55 PM _______________________________________________________________________  (1) Order result status: Final Collection or observation date-time: 11/25/2009 13:50 Requested date-time:  Receipt date-time:  Reported date-time:  Referring Physician:   Ordering Physician: Claudette Head [phone]) Specimen Source:  Source: [name] Order Number: [id number] Lab site:

## 2010-06-29 NOTE — Procedures (Signed)
Summary: Colonoscopy   Colonoscopy  Procedure date:  09/05/2003  Findings:      Results: Polyp.  Results: Diverticulosis.       Location:  [facility].    Procedures Next Due Date:    Colonoscopy: 08/2006  Colonoscopy  Procedure date:  09/05/2003  Findings:      Results: Polyp.  Results: Diverticulosis.       Location:  [facility].    Procedures Next Due Date:    Colonoscopy: 08/2006 Patient Name: Alexander [name], Alexander [name] MRN: [id number] Procedure Procedures: Colonoscopy CPT: 54098.    with Hot Biopsy(s)CPT: Z451292.    with polypectomy. CPT: A3573898.  Personnel: Endoscopist: Venita Lick. Russella Dar, MD, Clementeen Graham.  Referred By: Arsenio Loader, MD.  Exam Location: Exam performed in Outpatient Clinic. Outpatient  Patient Consent: Procedure, Alternatives, Risks and Benefits discussed, consent obtained, from patient. Consent was obtained by the RN.  Indications  Evaluation of: Positive fecal occult blood test  History  Current Medications: Patient is not currently taking Coumadin.  Pre-Exam Physical: Performed Sep 05, 2003. Entire physical exam was normal.  Exam Exam: Extent of exam reached: Cecum, extent intended: Cecum.  The cecum was identified by appendiceal orifice and IC valve. Colon retroflexion performed. ASA Classification: II. Tolerance: excellent.  Monitoring: Pulse and BP monitoring, Oximetry used. Supplemental O2 given.  Colon Prep Used Miralax for colon prep. Prep results: excellent.  Sedation Meds: Patient assessed and found to be appropriate for moderate (conscious) sedation. Fentanyl 125 mcg. given IV. Versed 12 given IV.  Findings NORMAL EXAM: Hepatic Flexure to Descending Colon.  MULTIPLE POLYPS: Ascending Colon. minimum size 6 mm, maximum size 7 mm. Procedure:  snare with cautery, removed, Polyp retrieved, 2 polyps Polyps sent to pathology. ICD9: Colon Polyps: 211.3.  NORMAL EXAM: Cecum.  - DIVERTICULOSIS: Sigmoid Colon. Not bleeding.  ICD9: Diverticulosis: 562.10.  POLYP: Sigmoid Colon, Maximum size: 4 mm. sessile polyp. Procedure:  hot biopsy, removed, retrieved, Polyp sent to pathology. ICD9: Colon Polyps: 211.3.  POLYP: Rectum, Maximum size: 17 mm. pedunculated polyp. Procedure:  snare with cautery, The polyp was removed piece meal. removed, retrieved, sent to pathology. ICD9: Colon Polyps: 211.3. Comments: located at 10-12 cm from anal verge.   Assessment  Diagnoses: 211.3: Colon Polyps.  562.10: Diverticulosis.   Events  Unplanned Interventions: No intervention was required.  Unplanned Events: There were no complications. Plans  Post Exam Instructions: No aspirin or non-steroidal containing medications: 2 weeks.  Medication Plan: Await pathology. Continue current medications.  Patient Education: Patient given standard instructions for: Polyps. Diverticulosis.  Disposition: After procedure patient sent to recovery. After recovery patient sent home.  Scheduling/Referral: Colonoscopy, to [facility] T. Russella Dar, MD, [facility], around Sep 05, 2006.  Primary Care Provider, to Arsenio Loader, MD,    This report was created from the original endoscopy report, which was reviewed and signed by the above listed endoscopist.    cc: Arsenio Loader, MD

## 2010-06-29 NOTE — Progress Notes (Signed)
Summary: Schedule EGD   Phone Note Outgoing Call Call back at [facility] Phone [phone]   Call placed by: Darcey Nora RN, CGRN,  Oct 23, 2009 8:57 AM Call placed to: Patient Summary of Call: Placed a call to the patient to schedule ESA.  I have asked him to call me back to schedule Initial call taken by: Darcey Nora RN, CGRN,  Oct 23, 2009 8:58 AM  Follow-up for Phone Call        Patient  scheduled for ESA 11-25-09 1:30.  I will mail him instructions.  He reports that the pharmacy said they didn't get rx I will resend prevacid.  Follow-up by: Darcey Nora RN, CGRN,  Oct 27, 2009 9:45 AM  New Problems: ESOPHAGEAL STRICTURE (ICD-530.3)   New Problems: ESOPHAGEAL STRICTURE (ICD-530.3) Prescriptions: PREVACID SOLUTAB 30 MG TBDP (LANSOPRAZOLE) 1 solutab dissolve under tongue every morning  #30 x 11   Entered by:   Darcey Nora RN, CGRN   Authorized by:   Meryl Dare MD [facility]   Signed by:   Darcey Nora RN, CGRN on 10/27/2009   Method used:   Electronically to        [facility] [phone]* (retail)       [address]       [place], [place]  [address]       Ph: [phone] or [phone]       Fax: [phone]   RxID:   [id number]

## 2010-06-29 NOTE — Procedures (Signed)
Summary: EGD   EGD  Procedure date:  01/17/2006  Findings:      Findings: Stricture:  Location: [facility]    Procedures Next Due Date:    EGD: 01/2006  EGD  Procedure date:  01/17/2006  Findings:      Findings: Stricture:  Location: [facility]    Procedures Next Due Date:    EGD: 01/2006 Patient Name: Alexander [name], Alexander [name] MRN: [id number] Procedure Procedures: Panendoscopy (EGD) CPT: 43235.    with esophageal dilation. CPT: G9296129.    with fluoroscopy esoph stricture, CPT: 11914-78 Personnel: Endoscopist: Venita Lick. Russella Dar, MD, Clementeen Graham.  Exam Location: Exam performed in Endoscopy Suite. Inpatient-ward  Patient Consent: Procedure, Alternatives, Risks and Benefits discussed, consent obtained, from patient. Consent was obtained by the RN.  Indications  Therapeutics: Reason for exam: Esophageal dilation.  Symptoms: Dysphagia.  History  Current Medications: Patient is not currently taking Coumadin.  Pre-Exam Physical: Performed Jan 17, 2006  Cardio-pulmonary exam, HEENT exam, Abdominal exam, Mental status exam WNL.  Comments: Pt. history reviewed/updated, physical exam performed prior to initiation of sedation?Yes Exam Exam Info: Maximum depth of insertion Duodenum, intended Duodenum. Vocal cords not visualized. Gastric retroflexion performed. ASA Classification: II. Tolerance: excellent.  Sedation Meds: Patient assessed and found to be appropriate for moderate (conscious) sedation. Fentanyl 100 mcg. given IV. Versed 10 mg. given IV. Cetacaine Spray 2 sprays given aerosolized.  Monitoring: BP and pulse monitoring done. Oximetry used. Supplemental O2 given  Fluoroscopy: Fluoroscopy was used.  Findings Normal: Proximal Esophagus to Mid Esophagus.  STRICTURE / STENOSIS: Stricture in Distal Esophagus.  Lumen diameter is 11 mm. ICD9: Esophageal Stricture: 530.3.  - Dilation: Mid Esophagus. Procedure was performed under Fluoroscopy. Savary dilator  used, Diameter: 12 mm, Minimal Resistance, No Heme present on extraction. Savary dilator used, Diameter: 12.8 mm, Minimal Resistance, No Heme present on extraction. Savary dilator used, Diameter: 14 mm, Minimal Resistance, Minimal Heme present on extraction. Patient tolerance excellent. Outcome: successful.  HIATAL HERNIA: Regular, 5 cms. in length. ICD9: Hernia, Hiatal: 553.3. Normal: Fundus to Duodenal 2nd Portion.   Assessment  Diagnoses: 553.3: Hernia, Hiatal.  530.3: Esophageal Stricture.   Events  Unplanned Intervention: No unplanned interventions were required.  Unplanned Events: There were no complications. Plans Instructions: Nothing to eat or drink for 1 hour.  Clear or full liquids: 1 hour.  Medication(s): PPI: QAM, for indefinitely.   Patient Education: Patient given standard instructions for: Hiatal Hernia. Reflux. Stenosis / Stricture.  Disposition: After procedure patient sent to recovery. After recovery patient sent home.  Scheduling: EGD, to [employer]. Russella Dar, MD, [facility], with Savary dilation, no fluoro around Feb 07, 2006.    This report was created from the original endoscopy report, which was reviewed and signed by the above listed endoscopist.    cc: Holley Bouche, MD

## 2010-06-29 NOTE — Miscellaneous (Signed)
Summary: [facility]  Clinical Lists Changes  Observations: Added new observation of NKA: T (08/07/2009 8:22)

## 2010-06-29 NOTE — Letter (Signed)
Summary: EGD Instructions  [facility]  [address] [place], [place] [address]   Phone: [phone]  Fax: [phone]       Alexander [name]    [birth year]    MRN: [id number]       Procedure Day /Date:  Friday 10/09/2009     Arrival Time: 10:00 am     Procedure Time: 11:00 am     Location of Procedure:                    _ x _ [facility] (4th Floor)   PREPARATION FOR ENDOSCOPY   On Friday 5/13 THE DAY OF THE PROCEDURE:  1.   No solid foods, milk or milk products are allowed after midnight the night before your procedure.  2.   Do not drink anything colored red or purple.  Avoid juices with pulp.  No orange juice.  3.  You may drink clear liquids until 9:00 am, which is 2 hours before your procedure.                                                                                                CLEAR LIQUIDS INCLUDE: Water Jello Ice Popsicles Tea (sugar ok, no milk/cream) Powdered fruit flavored drinks Coffee (sugar ok, no milk/cream) Gatorade Juice: apple, white grape, white cranberry  Lemonade Clear bullion, consomm, broth Carbonated beverages (any kind) Strained chicken noodle soup Hard Candy   MEDICATION INSTRUCTIONS  Unless otherwise instructed, you should take regular prescription medications with a small sip of water as early as possible the morning of your procedure.    Additional medication instructions:  Do not take Lisinopril/HCTZ day of procedure.             OTHER INSTRUCTIONS  You will need a responsible adult at least 50 years of age to accompany you and drive you home.   This person must remain in the waiting room during your procedure.  Wear loose fitting clothing that is easily removed.  Leave jewelry and other valuables at home.  However, you may wish to bring a book to read or an iPod/MP3 player to listen to music as you wait for your procedure to start.  Remove all body piercing jewelry and leave at  home.  Total time from sign-in until discharge is approximately 2-3 hours.  You should go home directly after your procedure and rest.  You can resume normal activities the day after your procedure.  The day of your procedure you should not:   Drive   Make legal decisions   Operate machinery   Drink alcohol   Return to work  You will receive specific instructions about eating, activities and medications before you leave.    The above instructions have been reviewed and explained to me by   Ezra Sites RN  September 25, 2009 4:29 PM     I fully understand and can verbalize these instructions _____________________________ Date _________

## 2010-06-29 NOTE — Medication Information (Signed)
Summary: Lansoprazole approved til2/3/11/[employer]  Lansoprazole approved til2/3/11/[employer]   Imported By: Lester [place] 05/06/2009 08:07:06  _____________________________________________________________________  External Attachment:    Type:   Image     Comment:   External Document

## 2010-06-29 NOTE — Letter (Signed)
Summary: EGD Instructions  [facility]  [address] [place], [place] [address]   Phone: [phone]  Fax: [phone]       Alexander [name]    1960-07-29    MRN: [id number]       Procedure Day /Date: Thursday 08/27/2009     Arrival Time:  9:30 am     Procedure Time: 10:30 am     Location of Procedure:                    _x  _ [facility] (4th Floor)   PREPARATION FOR ENDOSCOPY   On Thursday 3/31 THE DAY OF THE PROCEDURE:  1.   No solid foods, milk or milk products are allowed after midnight the night before your procedure.  2.   Do not drink anything colored red or purple.  Avoid juices with pulp.  No orange juice.  3.  You may drink clear liquids until 8:30 am, which is 2 hours before your procedure.                                                                                                CLEAR LIQUIDS INCLUDE: Water Jello Ice Popsicles Tea (sugar ok, no milk/cream) Powdered fruit flavored drinks Coffee (sugar ok, no milk/cream) Gatorade Juice: apple, white grape, white cranberry  Lemonade Clear bullion, consomm, broth Carbonated beverages (any kind) Strained chicken noodle soup Hard Candy   MEDICATION INSTRUCTIONS  Unless otherwise instructed, you should take regular prescription medications with a small sip of water as early as possible the morning of your procedure.      Additional medication instructions:  Do not take Lisinopril/HCTZ morning of procedure.             OTHER INSTRUCTIONS  You will need a responsible adult at least 50 years of age to accompany you and drive you home.   This person must remain in the waiting room during your procedure.  Wear loose fitting clothing that is easily removed.  Leave jewelry and other valuables at home.  However, you may wish to bring a book to read or an iPod/MP3 player to listen to music as you wait for your procedure to start.  Remove all body piercing jewelry and leave  at home.  Total time from sign-in until discharge is approximately 2-3 hours.  You should go home directly after your procedure and rest.  You can resume normal activities the day after your procedure.  The day of your procedure you should not:   Drive   Make legal decisions   Operate machinery   Drink alcohol   Return to work  You will receive specific instructions about eating, activities and medications before you leave.    The above instructions have been reviewed and explained to me by   Ezra Sites RN  August 07, 2009 8:44 AM    I fully understand and can verbalize these instructions _____________________________ Date _________

## 2010-06-29 NOTE — Letter (Signed)
Summary: [facility] Instructions  [facility]  [address] [place], [place] [address]   Phone: [phone]  Fax: [phone]       TRACEY STEWART    09/21/1960    MRN: [id number]        Procedure Day /Date:Tuesday January 11th, 2011     Arrival Time: 8:00am     Procedure Time: 9:00am     Location of Procedure:                    _ x_  [facility] (4th Floor)                        PREPARATION FOR COLONOSCOPY WITH MOVIPREP   Starting 5 days prior to your procedure 06/04/09  do not eat nuts, seeds, popcorn, corn, beans, peas,  salads, or any raw vegetables.  Do not take any fiber supplements (e.g. Metamucil, Citrucel, and Benefiber).  THE DAY BEFORE YOUR PROCEDURE         DATE:  06/08/09   DAY:  Monday   1.  Drink clear liquids the entire day-NO SOLID FOOD  2.  Do not drink anything colored red or purple.  Avoid juices with pulp.  No orange juice.  3.  Drink at least 64 oz. (8 glasses) of fluid/clear liquids during the day to prevent dehydration and help the prep work efficiently.  CLEAR LIQUIDS INCLUDE: Water Jello Ice Popsicles Tea (sugar ok, no milk/cream) Powdered fruit flavored drinks Coffee (sugar ok, no milk/cream) Gatorade Juice: apple, white grape, white cranberry  Lemonade Clear bullion, consomm, broth Carbonated beverages (any kind) Strained chicken noodle soup Hard Candy                             4.  In the morning, mix first dose of MoviPrep solution:    Empty 1 Pouch A and 1 Pouch B into the disposable container    Add lukewarm drinking water to the top line of the container. Mix to dissolve    Refrigerate (mixed solution should be used within 24 hrs)  5.  Begin drinking the prep at 5:00 p.m. The MoviPrep container is divided by 4 marks.   Every 15 minutes drink the solution down to the next mark (approximately 8 oz) until the full liter is complete.   6.  Follow completed prep with 16 oz of clear liquid of your choice  (Nothing red or purple).  Continue to drink clear liquids until bedtime.  7.  Before going to bed, mix second dose of MoviPrep solution:    Empty 1 Pouch A and 1 Pouch B into the disposable container    Add lukewarm drinking water to the top line of the container. Mix to dissolve    Refrigerate  THE DAY OF YOUR PROCEDURE      DATE:  06/09/09  DAY:  Tuesday  Beginning at 4:00 a.m. (5 hours before procedure):         1. Every 15 minutes, drink the solution down to the next mark (approx 8 oz) until the full liter is complete.  2. Follow completed prep with 16 oz. of clear liquid of your choice.    3. You may drink clear liquids until 7:00am  (2 HOURS BEFORE PROCEDURE).   MEDICATION INSTRUCTIONS  Unless otherwise instructed, you should take regular prescription medications with a small sip of water  as early as possible the morning of your procedure.        OTHER INSTRUCTIONS  You will need a responsible adult at least 50 years of age to accompany you and drive you home.   This person must remain in the waiting room during your procedure.  Wear loose fitting clothing that is easily removed.  Leave jewelry and other valuables at home.  However, you may wish to bring a book to read or  an iPod/MP3 player to listen to music as you wait for your procedure to start.  Remove all body piercing jewelry and leave at home.  Total time from sign-in until discharge is approximately 2-3 hours.  You should go home directly after your procedure and rest.  You can resume normal activities the  day after your procedure.  The day of your procedure you should not:   Drive   Make legal decisions   Operate machinery   Drink alcohol   Return to work  You will receive specific instructions about eating, activities and medications before you leave.    The above instructions have been reviewed and explained to me by   Estill Bamberg.     I fully understand and can verbalize these  instructions _____________________________ Date _________

## 2010-06-29 NOTE — Procedures (Signed)
Summary: EGD   EGD  Procedure date:  05/17/2006  Findings:      Findings: Esophagitis  Findings: Stricture:  Location: [facility]    Procedures Next Due Date:    EGD: 06/2006 Patient Name: Alexander [name], Alexander [name] MRN: [id number] Procedure Procedures: Panendoscopy (EGD) CPT: 43235.    with esophageal dilation. CPT: G9296129.  Personnel: Endoscopist: Venita Lick. Russella Dar, MD, Clementeen Graham.  Exam Location: Exam performed in Outpatient Clinic. Outpatient  Patient Consent: Procedure, Alternatives, Risks and Benefits discussed, consent obtained, from patient. Consent was obtained by the RN.  Indications  Therapeutics: Reason for exam: Esophageal dilation.  Symptoms: Dysphagia.  History  Current Medications: Patient is not currently taking Coumadin.  Pre-Exam Physical: Performed Jan 17, 2006  Cardio-pulmonary exam, HEENT exam, Abdominal exam, Mental status exam WNL.  Comments: Pt. history reviewed/updated, physical exam performed prior to initiation of sedation?Yes Exam Exam Info: Maximum depth of insertion Duodenum, intended Duodenum. Vocal cords not visualized. Gastric retroflexion performed. ASA Classification: II. Tolerance: excellent.  Sedation Meds: Patient assessed and found to be appropriate for moderate (conscious) sedation. Fentanyl 100 mcg. given IV. Versed 10 mg. given IV. Cetacaine Spray 2 sprays given aerosolized.  Monitoring: BP and pulse monitoring done. Oximetry used. Supplemental O2 given  Findings Normal: Proximal Esophagus to Mid Esophagus.  ESOPHAGEAL INFLAMMATION: Severity is moderate, erosions present.  [place] Classification: Grade B. ICD9: Esophagitis, Reflux: 530.11.  STRICTURE / STENOSIS: Stricture in Distal Esophagus.  Constriction: partial. ICD9: Esophageal Stricture: 530.3.  - Dilation: Mid Esophagus. Savary dilator used, Diameter: 13 mm, Minimal Resistance, No Heme present on extraction. Savary dilator used, Diameter: 14 mm, Minimal  Resistance, Minimal Heme present on extraction. Savary dilator used, Diameter: 15 mm, Minimal Resistance, Minimal Heme present on extraction. Patient tolerance excellent. Outcome: successful.  HIATAL HERNIA: Regular, 5 cms. in length. ICD9: Hernia, Hiatal: 553.3. Normal: Fundus to Duodenal 2nd Portion.   Assessment  Diagnoses: 530.3: Esophageal Stricture.  553.3: Hernia, Hiatal.  530.11: Esophagitis, Reflux.   Events  Unplanned Intervention: No unplanned interventions were required.  Unplanned Events: There were no complications. Plans Instructions: Nothing to eat or drink for 1 hour.  Clear or full liquids: 1 hour.  Medication(s): PPI: QAM, for indefinitely.   Patient Education: Patient given standard instructions for: Hiatal Hernia. Reflux. Stenosis / Stricture.  Disposition: After procedure patient sent to recovery. After recovery patient sent home.  Scheduling: EGD, to [employer]. Russella Dar, MD, [facility], continue dilations around Jun 24, 2006.    This report was created from the original endoscopy report, which was reviewed and signed by the above listed endoscopist.    cc: Holley Bouche, MD

## 2010-07-01 NOTE — Medication Information (Signed)
Summary: Approved Lansoprazole / [employer]  Approved Lansoprazole / [employer]   Imported By: Lennie Odor 06/02/2010 15:03:19  _____________________________________________________________________  External Attachment:    Type:   Image     Comment:   External Document

## 2010-07-06 ENCOUNTER — Other Ambulatory Visit: Payer: Self-pay | Admitting: Family Medicine

## 2010-07-06 DIAGNOSIS — R200 Anesthesia of skin: Secondary | ICD-10-CM

## 2010-07-06 DIAGNOSIS — R42 Dizziness and giddiness: Secondary | ICD-10-CM

## 2010-07-07 ENCOUNTER — Ambulatory Visit
Admission: RE | Admit: 2010-07-07 | Discharge: 2010-07-07 | Disposition: A | Discharge status: Home / Self Care | Payer: 59 | Source: Ambulatory Visit | Attending: Family Medicine | Admitting: Family Medicine

## 2010-07-07 DIAGNOSIS — R42 Dizziness and giddiness: Secondary | ICD-10-CM

## 2010-07-07 DIAGNOSIS — R200 Anesthesia of skin: Secondary | ICD-10-CM

## 2010-10-15 NOTE — Assessment & Plan Note (Signed)
[facility]                           GASTROENTEROLOGY OFFICE NOTE   NAME:Alexander [name], Alexander [name]                     MRN:          [id number]  DATE:12/20/2005                            DOB:          29-Aug-1960    REFERRING PHYSICIAN:  Holley Bouche, MD   REASON FOR REFERRAL:  Dysphagia, abnormal upper GI series.   HISTORY OF PRESENT ILLNESS:  [name] is a very nice 50 year old white male  who I have seen previously.  Colonoscopy for Hemoccult positive stool was  performed in April, 2005, which showed adenomatous polyps and tubulovillous  adenomatous polyps.  Recall colonoscopy is planned for April, 2008.  He  relates intermittent problems with GERD since about age 50.  He was  diagnosed with pulmonary sarcoidosis and placed on prednisone for short  term.  He states that his reflux symptoms began around that time.  His  pulmonary sarcoidosis has not been active for years, and he was on no  treatment for it.  He has had intermittent, frequent nocturnal reflux  problems for many years.  The past few years, his symptoms have really been  quite infrequent.  He has had difficulty swallowing pills for many years but  has done fine with solid foods.  Over the past 3-4 months, he has noticed  progressively worsening solid food dysphagia.  He has had multiple episodes  where food feels like it sticks, and he has self-induced vomiting to relieve  a food impaction on multiple occasions.  He has frequent early satiety and  some post prandial belching.  He has infrequent heartburn and regurgitation  mainly occurring at night when he has eaten late.   Upper GI series performed at [facility] on October 28, 2005 showed a  large hiatal hernia measuring about 6-8 cm with a distal esophageal  stricture estimated at 10 mm or less.  A barium tablet was not used, as the  patient states he has a very difficult time swallowing pills.  He has no  weight loss, abdominal  pain, chest pain, melena, hematochezia, nausea,  vomiting, or hematemesis.  No family history of colon cancer, colon polyps,  or inflammatory bowel disease.   PAST MEDICAL HISTORY:  1.  Pulmonary sarcoidosis at age 75, current inactive.  2.  Tubulovillous adenomatous colon polyps.  3.  Hiatal hernia.  4.  Esophageal stricture.   CURRENT MEDICATIONS:  1.  Chewable aspirin p.r.n.  2.  Sudafed p.r.n.   MEDICATION ALLERGIES:  None known.   SOCIAL HISTORY:  Married with three children.  He denies tobacco and alcohol  product usage.   REVIEW OF SYSTEMS:  Entirely negative.  See the handwritten form.   PHYSICAL EXAMINATION:  GENERAL:  No acute distress.  Height 6 feet, 2  inches.  Weight 214 pounds.  VITAL SIGNS:  Blood pressure 128/86, pulse 74 and regular.  HEENT:  Anicteric sclerae.  Oropharynx clear.  CHEST:  Clear to auscultation bilaterally.  CARDIAC:  Regular rate and rhythm without murmurs.  ABDOMEN:  Soft, nontender, nondistended.  Normoactive bowel sounds.  No  palpable organomegaly, masses, or hernias.  RECTAL:  Deferred.  EXTREMITIES:  Without clubbing, cyanosis or edema.  NEUROLOGIC:  Alert and oriented x3.  Grossly nonfocal.   ASSESSMENT/PLAN:  Progressively worsening solid food dysphagia.  Abnormal  upper gastrointestinal series with a large hiatal hernia and stricture.  Rule out peptic stricture.  Presumed chronic gastroesophageal reflux  disease.  Begin Prevacid Solutabs 30 mg p.o. q.a.m. along with standard  antireflux measures.  Risks, benefits and alternatives to upper endoscopy  with possible biopsy and possible dilation.  Discussed with the patient and  he consents to proceed.  This will be scheduled electively.  He will likely  need several dilations, based on the size noted at upper GI series.  He will  need to remain on a proton pump inhibitor with all standard antireflux  measures long term.                                   Venita Lick. Pleas Koch., MD,  Clementeen Graham   MTS/MedQ  DD:  12/20/2005  DT:  12/20/2005  Job #:  [id number]   cc:   Holley Bouche, MD

## 2011-03-10 ENCOUNTER — Encounter: Payer: Self-pay | Admitting: Gastroenterology

## 2012-02-24 ENCOUNTER — Encounter: Payer: Self-pay | Admitting: Gastroenterology

## 2012-06-01 ENCOUNTER — Other Ambulatory Visit: Payer: Self-pay | Admitting: Family Medicine

## 2012-06-01 ENCOUNTER — Ambulatory Visit
Admission: RE | Admit: 2012-06-01 | Discharge: 2012-06-01 | Disposition: A | Discharge status: Home / Self Care | Payer: PRIVATE HEALTH INSURANCE | Source: Ambulatory Visit | Attending: Family Medicine | Admitting: Family Medicine

## 2012-06-01 DIAGNOSIS — R9389 Abnormal findings on diagnostic imaging of other specified body structures: Secondary | ICD-10-CM

## 2012-06-01 MED ORDER — IOHEXOL 300 MG/ML  SOLN
75.0000 mL | Freq: Once | INTRAMUSCULAR | Status: AC | PRN
  Administered 2012-06-01: 75 mL via INTRAVENOUS

## 2012-06-13 ENCOUNTER — Encounter: Payer: Self-pay | Admitting: Internal Medicine

## 2012-06-13 ENCOUNTER — Ambulatory Visit (INDEPENDENT_AMBULATORY_CARE_PROVIDER_SITE_OTHER): Payer: PRIVATE HEALTH INSURANCE | Admitting: Internal Medicine

## 2012-06-13 VITALS — BP 112/90 | HR 92 | Temp 97.1°F | Ht 74.5 in | Wt 226.0 lb

## 2012-06-13 DIAGNOSIS — R059 Cough, unspecified: Secondary | ICD-10-CM

## 2012-06-13 DIAGNOSIS — R05 Cough: Secondary | ICD-10-CM

## 2012-06-13 DIAGNOSIS — D869 Sarcoidosis, unspecified: Secondary | ICD-10-CM

## 2012-06-13 DIAGNOSIS — I10 Essential (primary) hypertension: Secondary | ICD-10-CM

## 2012-06-13 NOTE — Patient Instructions (Addendum)
For now I don't recommend any change in your treatment - there is no evidence of active sarcoidosis  Your ace inhibitor (blood pressure pill) is a potential problem any time you catch a cold or get any kind of injury to your upper airway (like oxygen is to fire) and may need to be stopped in the future and replaced with ARB - deferred to Dr Tiburcio Pea

## 2012-06-13 NOTE — Progress Notes (Signed)
  Subjective:    Patient ID: Alexander [name], male    DOB: Feb 17, 1961   MRN: [id number]  HPI  17 yowm never smoker dx with sarcoid presenting as cough in early 90's dx by [facility] with prednisone x 6 months and none since then referred by Dr Johny Blamer for eval of severe cough.   06/13/2012 1st pulmonary eval in EMR era cc severe cough abrupt onset  Nov 15th while on ACEI rx with zpak and better after prednisone rx x 2 weeks and heavy cough suppression and feeling 100% at ov but concerned because he had hx sarcoid and the cough seemed to go on for weeks and weeks, minimally productive and no sob, arthralgias, rash, ocular complaints, F C NS  Sleeping ok without nocturnal  or early am exacerbation  of respiratory  c/o's or need for noct saba. Also denies any obvious fluctuation of symptoms with weather or environmental changes or other aggravating or alleviating factors except as outlined above    Review of Systems  Constitutional: Positive for fever. Negative for unexpected weight change.  HENT: Positive for trouble swallowing and postnasal drip. Negative for ear pain, nosebleeds, congestion, sore throat, rhinorrhea, sneezing, dental problem and sinus pressure.   Eyes: Negative for redness and itching.  Respiratory: Positive for wheezing. Negative for cough, chest tightness and shortness of breath.   Cardiovascular: Negative for palpitations and leg swelling.  Gastrointestinal: Negative for nausea and vomiting.  Genitourinary: Negative for dysuria.  Musculoskeletal: Negative for joint swelling.  Skin: Negative for rash.  Neurological: Negative for headaches.  Hematological: Does not bruise/bleed easily.  Psychiatric/Behavioral: Negative for dysphoric mood. The patient is not nervous/anxious.        Objective:   Physical Exam  Pleasant amb wm   HEENT: nl dentition, turbinates, and orophanx. Nl external ear canals without cough reflex   NECK :  without JVD/Nodes/TM/ nl carotid  upstrokes bilaterally   LUNGS: no acc muscle use, clear to A and P bilaterally without cough on insp or exp maneuvers   CV:  RRR  no s3 or murmur or increase in P2, no edema   ABD:  soft and nontender with nl excursion in the supine position. No bruits or organomegaly, bowel sounds nl  MS:  warm without deformities, calf tenderness, cyanosis or clubbing  SKIN: warm and dry without lesions    NEURO:  alert, approp, no deficits      06/01/12 CT chest  1. The area questioned on chest x-ray most likely was due to  slightly prominent epicardial fat at the right cardiophrenic angle.  No cyst or mass is seen.  2. Faint coronary artery calcifications.  3. Question small hiatal hernia.       Assessment & Plan:

## 2012-06-14 DIAGNOSIS — D869 Sarcoidosis, unspecified: Secondary | ICD-10-CM | POA: Insufficient documentation

## 2012-06-14 DIAGNOSIS — I10 Essential (primary) hypertension: Secondary | ICD-10-CM | POA: Insufficient documentation

## 2012-06-14 NOTE — Assessment & Plan Note (Signed)
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   These are the same pts (now being labeled as having "irritable larynx syndrome" by some cough centers) who not infrequently have a history of having failed to tolerate ace inhibitors,  dry powder inhalers or biphosphonates or report having atypical reflux symptoms that don't respond to standard doses of PPI , and are easily confused as having aecopd or asthma flares by even experienced allergists/ pulmonologists.   Nothing further to do now x consider change to arb at onset of next cough for any reason (see HBP discussed separately)

## 2012-06-14 NOTE — Assessment & Plan Note (Signed)
ACE inhibitors are problematic in  pts with airway complaints because  even experienced pulmonologists can't always distinguish ace effects from copd/asthma/pnds/ allergies etc.  By themselves they don't actually cause a problem, much like oxygen can't by itself start a fire, but they certainly serve as a powerful catalyst or enhancer for any "fire"  or inflammatory process in the upper airway, be it caused by an ET  tube or more commonly reflux (especially in the obese or pts with known GERD or who are on biphoshonates) or URI's, due to interference with bradykinin clearance.  The effects of acei on bradykinin levels occurs in 100% of pt's on acei (unless they surreptitiously stop the med!) but the classic cough is only reported in 5%.  This leaves 95% of pts on acei's  with a variety of syndromes including no identifiable symptom in most  vs non-specific symptoms that wax and wane depending on what other insult is occuring at the level of the upper airway ( in this case uri/ GERD come to mind)  He's doing great again now that the other insults have resolved but sitting potentially on a pile of kindling should another "spark" come along and I would have a low threshold to change to ARB should the need arise.

## 2012-06-14 NOTE — Assessment & Plan Note (Signed)
No evidence of active sarcoidosis

## 2012-06-18 ENCOUNTER — Encounter: Payer: Self-pay | Admitting: Gastroenterology

## 2012-08-06 ENCOUNTER — Encounter: Payer: Self-pay | Admitting: Gastroenterology

## 2012-08-06 ENCOUNTER — Ambulatory Visit (INDEPENDENT_AMBULATORY_CARE_PROVIDER_SITE_OTHER): Payer: PRIVATE HEALTH INSURANCE | Admitting: Gastroenterology

## 2012-08-06 VITALS — BP 114/80 | HR 64 | Ht 74.5 in | Wt 227.0 lb

## 2012-08-06 DIAGNOSIS — K219 Gastro-esophageal reflux disease without esophagitis: Secondary | ICD-10-CM

## 2012-08-06 DIAGNOSIS — Z86010 Personal history of colonic polyps: Secondary | ICD-10-CM

## 2012-08-06 DIAGNOSIS — R1319 Other dysphagia: Secondary | ICD-10-CM

## 2012-08-06 MED ORDER — ESOMEPRAZOLE MAGNESIUM 40 MG PO CPDR: 40.0000 mg | DELAYED_RELEASE_CAPSULE | Freq: Every day | ORAL | Status: DC

## 2012-08-06 MED ORDER — PEG-KCL-NACL-NASULF-NA ASC-C 100 G PO SOLR: 1.0000 | Freq: Once | ORAL | Status: DC

## 2012-08-06 NOTE — Patient Instructions (Addendum)
You have been scheduled for an endoscopy and colonoscopy with propofol. Please follow the written instructions given to you at your visit today. Please pick up your prep at the pharmacy within the next 1-3 days. If you use inhalers (even only as needed), please bring them with you on the day of your procedure.  We have sent the following medications to your pharmacy for you to pick up at your convenience: Nexium. You have been given samples of Nexium to get you started.   Thank you for choosing me and [facility].  Venita Lick. Pleas Koch., MD., Clementeen Graham

## 2012-08-06 NOTE — Progress Notes (Signed)
History of Present Illness: This is a 52 year old male with a history of GERD and recurrent esophageal strictures. He has had multiple dilations and he last underwent dilation 2011 that required 4 separate endoscopy to achieve a 17 mm dilation. He has noted return of solid food dysphagia for the past 6 months and he denies ongoing reflux symptoms. He states he cannot swallow pills so he stopped taking his PPI when his insurance stopped covering Prevacid Solutabs. Denies weight loss, abdominal pain, constipation, diarrhea, change in stool caliber, melena, hematochezia, nausea, vomiting, chest pain.  Current Medications, Allergies, Past Medical History, Past Surgical History, Family History and Social History were reviewed in [employer] record.  Physical Exam: General: Well developed , well nourished, no acute distress Head: Normocephalic and atraumatic Eyes:  sclerae anicteric, EOMI Ears: Normal auditory acuity Mouth: No deformity or lesions Lungs: Clear throughout to auscultation Heart: Regular rate and rhythm; no murmurs, rubs or bruits Abdomen: Soft, non tender and non distended. No masses, hepatosplenomegaly or hernias noted. Normal Bowel sounds Rectal: Deferred to colonoscopy Musculoskeletal: Symmetrical with no gross deformities  Pulses:  Normal pulses noted Extremities: No clubbing, cyanosis, edema or deformities noted Neurological: Alert oriented x 4, grossly nonfocal Psychological:  Alert and cooperative. Normal mood and affect  Assessment and Recommendations:  1. Solid food dysphagia, suspected recurrent esophageal stricture. GERD. He will need to resume a PPI daily that he can swallow easily or be willing today for a noncovered PPI brand that he can swallow. The risks, benefits, and alternatives to endoscopy with possible biopsy and possible dilation were discussed with the patient and they consent to proceed.   2 Personal history of a tubulovillous adenoma.  His 3 year surveillance colonoscopy is due now. The risks, benefits, and alternatives to colonoscopy with possible biopsy and possible polypectomy were discussed with the patient and they consent to proceed.  Marland Kitchen

## 2012-08-08 ENCOUNTER — Encounter: Payer: Self-pay | Admitting: Gastroenterology

## 2012-08-21 ENCOUNTER — Ambulatory Visit (AMBULATORY_SURGERY_CENTER): Payer: PRIVATE HEALTH INSURANCE | Admitting: Gastroenterology

## 2012-08-21 ENCOUNTER — Encounter: Payer: Self-pay | Admitting: Gastroenterology

## 2012-08-21 VITALS — BP 111/81 | HR 70 | Temp 97.9°F | Resp 16 | Ht 74.0 in | Wt 227.0 lb

## 2012-08-21 DIAGNOSIS — Z1211 Encounter for screening for malignant neoplasm of colon: Secondary | ICD-10-CM

## 2012-08-21 DIAGNOSIS — D126 Benign neoplasm of colon, unspecified: Secondary | ICD-10-CM

## 2012-08-21 DIAGNOSIS — R1319 Other dysphagia: Secondary | ICD-10-CM

## 2012-08-21 DIAGNOSIS — Z86010 Personal history of colonic polyps: Secondary | ICD-10-CM

## 2012-08-21 DIAGNOSIS — K222 Esophageal obstruction: Secondary | ICD-10-CM

## 2012-08-21 DIAGNOSIS — R198 Other specified symptoms and signs involving the digestive system and abdomen: Secondary | ICD-10-CM

## 2012-08-21 DIAGNOSIS — K219 Gastro-esophageal reflux disease without esophagitis: Secondary | ICD-10-CM

## 2012-08-21 MED ORDER — SODIUM CHLORIDE 0.9 % IV SOLN: 500.0000 mL | INTRAVENOUS | Status: DC

## 2012-08-21 NOTE — Op Note (Signed)
[facility] [address] [place], [address]   COLONOSCOPY PROCEDURE REPORT  PATIENT: Alexander, [name]  MR#: [id number] BIRTHDATE: August 17, 1960 , 51  yrs. old GENDER: Male ENDOSCOPIST: Meryl Dare, MD, [facility] PROCEDURE DATE:  08/21/2012 PROCEDURE:   Colonoscopy with snare polypectomy ASA CLASS:   Class II INDICATIONS:Patient's personal history of adenomatous colon polyps: TVA 2005. MEDICATIONS: MAC sedation, administered by CRNA and propofol (Diprivan) 300mg  IV DESCRIPTION OF PROCEDURE:   After the risks benefits and alternatives of the procedure were thoroughly explained, informed consent was obtained.  A digital rectal exam revealed no abnormalities of the rectum.   The     endoscope was introduced through the anus and advanced to the cecum, which was identified by both the appendix and ileocecal valve. No adverse events experienced.   The quality of the prep was adequate, using MoviPrep The instrument was then slowly withdrawn as the colon was fully examined.  COLON FINDINGS: A smooth sessile polyp measuring 1.5 cm in size with a mucous cap was found at the cecum.  Polypectomy was performed using hot snare.  All resections appeared to be complete and all polyp tissue was completely retrieved.   Moderate diverticulosis was noted in the transverse colon, descending colon, and sigmoid colon.   The colon was otherwise normal.  There was no diverticulosis, inflammation, polyps or cancers unless previously stated.  Retroflexed views revealed moderate internal hemorrhoids. The time to cecum=1 minutes 47 seconds.  Withdrawal time=12 minutes 00 seconds.  The scope was withdrawn and the procedure completed.  COMPLICATIONS: There were no complications.  ENDOSCOPIC IMPRESSION: 1.   Sessile polyp measuring 1.5 cm  at the cecum; Piecemeal polypectomy  performed using hot snare 2.   Moderate diverticulosis was noted in the transverse colon, descending colon, and sigmoid  colon 3.   Moderate internal hemorrhoids  RECOMMENDATIONS: 1.  Hold aspirin, aspirin products, and anti-inflammatory medication for 2 weeks. 2.  Await pathology results 3.  Repeat colonoscopy in year, if polyp adenomatous, to assess if polypectomy was complete; otherwise 3 years   eSigned:  Meryl Dare, MD, [facility] 08/21/2012 2:44 PM   cc: Holley Bouche, MD

## 2012-08-21 NOTE — Patient Instructions (Addendum)
YOU HAD AN ENDOSCOPIC PROCEDURE TODAY AT THE [facility]: Refer to the procedure report that was given to you for any specific questions about what was found during the examination.  If the procedure report does not answer your questions, please call your gastroenterologist to clarify.  If you requested that your care partner not be given the details of your procedure findings, then the procedure report has been included in a sealed envelope for you to review at your convenience later.  YOU SHOULD EXPECT: Some feelings of bloating in the abdomen. Passage of more gas than usual.  Walking can help get rid of the air that was put into your GI tract during the procedure and reduce the bloating. If you had a lower endoscopy (such as a colonoscopy or flexible sigmoidoscopy) you may notice spotting of blood in your stool or on the toilet paper. If you underwent a bowel prep for your procedure, then you may not have a normal bowel movement for a few days.  DIET: See dilation diet-  Drink plenty of fluids but you should avoid alcoholic beverages for 24 hours.  ACTIVITY: Your care partner should take you home directly after the procedure.  You should plan to take it easy, moving slowly for the rest of the day.  You can resume normal activity the day after the procedure however you should NOT DRIVE or use heavy machinery for 24 hours (because of the sedation medicines used during the test).    SYMPTOMS TO REPORT IMMEDIATELY: A gastroenterologist can be reached at any hour.  During normal business hours, 8:30 AM to 5:00 PM Monday through Friday, call [phone].  After hours and on weekends, please call the GI answering service at [phone] who will take a message and have the physician on call contact you.   Following lower endoscopy (colonoscopy or flexible sigmoidoscopy):  Excessive amounts of blood in the stool  Significant tenderness or worsening of abdominal pains  Swelling of the  abdomen that is new, acute  Fever of 100F or higher  Following upper endoscopy (EGD)  Vomiting of blood or coffee ground material  New chest pain or pain under the shoulder blades  Painful or persistently difficult swallowing  New shortness of breath  Fever of 100F or higher  Black, tarry-looking stools  FOLLOW UP: If any biopsies were taken you will be contacted by phone or by letter within the next 1-3 weeks.  Call your gastroenterologist if you have not heard about the biopsies in 3 weeks.  Our staff will call the home number listed on your records the next business day following your procedure to check on you and address any questions or concerns that you may have at that time regarding the information given to you following your procedure. This is a courtesy call and so if there is no answer at the home number and we have not heard from you through the emergency physician on call, we will assume that you have returned to your regular daily activities without incident.  SIGNATURES/CONFIDENTIALITY: You and/or your care partner have signed paperwork which will be entered into your electronic medical record.  These signatures attest to the fact that that the information above on your After Visit Summary has been reviewed and is understood.  Full responsibility of the confidentiality of this discharge information lies with you and/or your care-partner.  Polyp-handout given  Stricture-handout given  Dilation Diet-handout given  Diverticulosis-handout given  High Fiber Diet-handout given  Hemorrhoids-handout  given  Hiatal hernia and anti-reflux regimen-handout given  Repeat EGD/savory in 4 weeks- schedule for 5/1 at 0930, pre-visit scheduled at 4/21 at 1600   Continue nexium  Hold aspirin, aspirin products, and anti-inflammatory medications for 2 weeks (ibuprofen, motrin, meloxicam, naproxen, etc.)

## 2012-08-21 NOTE — Progress Notes (Signed)
Patient did not experience any of the following events: a burn prior to discharge; a fall within the facility; wrong site/side/patient/procedure/implant event; or a hospital transfer or hospital admission upon discharge from the facility. (G8907) Patient did not have preoperative order for IV antibiotic SSI prophylaxis. (G8918)  

## 2012-08-21 NOTE — Progress Notes (Addendum)
Called to room to assist during endoscopic procedure.  Patient ID and intended procedure confirmed with present staff. Received instructions for my participation in the procedure from the performing physician.Called to room to assist during endoscopic procedure.  Patient ID and intended procedure confirmed with present staff. Received instructions for my participation in the procedure from the performing physician. 

## 2012-08-21 NOTE — Op Note (Signed)
[facility] [address] [place], [address]   ENDOSCOPY PROCEDURE REPORT PATIENT: Alexander [name], Alexander [name]  MR#: [id number] BIRTHDATE: 1960/09/23 , 51  yrs. old GENDER: Male ENDOSCOPIST: Meryl Dare, MD, [facility] PROCEDURE DATE:  08/21/2012 PROCEDURE:  EGD, diagnostic and Savary dilation of esophagus ASA CLASS:     Class II INDICATIONS:  Dysphagia. MEDICATIONS: There was residual sedation effect present from prior procedure, MAC sedation, administered by CRNA, and propofol (Diprivan) 350mg  IV TOPICAL ANESTHETIC: none DESCRIPTION OF PROCEDURE: After the risks benefits and alternatives of the procedure were thoroughly explained, informed consent was obtained.  The LB-GIF [id number] [id number] endoscope was introduced through the mouth and advanced to the second portion of the duodenum without limitations.  The instrument was slowly withdrawn as the mucosa was fully examined.  ESOPHAGUS: A stricture was found at the gastroesophageal junction measuring about 11 mm in diameter.  The stenosis was traversable with the endoscope.  The esophagus was otherwise normal. STOMACH: The mucosa and folds of the stomach appeared normal. DUODENUM: The duodenal mucosa showed no abnormalities in the 2nd part of the duodenum.   Abnormal mucosa was found in the duodenal bulb.  The mucosa was erythematous.  Retroflexed views revealed a hiatal hernia.   A guidewire was placed and the scope was then withdrawn from the patient. 13 mm and 14 mm Savary dilators were passed over the guidewire with mild resistance and mild heme and the procedure completed. COMPLICATIONS: There were no complications.  ENDOSCOPIC IMPRESSION: 1.   Stricture at the gastroesophageal junction; dlated 2.   Small hiatal hernia 3.    Mild duodenitis  RECOMMENDATIONS: 1.  Anti-reflux regimen long term 2.  Continue PPI daily long term 3.  Post dilation instructions 4.  Repeat EGD/Savary dilation in about 4 weeks   eSigned:   Meryl Dare, MD, [facility] 08/21/2012 2:58 PM   XB:[id number] Tiburcio Pea, MD

## 2012-08-22 ENCOUNTER — Telehealth: Payer: Self-pay | Admitting: *Deleted

## 2012-08-22 NOTE — Telephone Encounter (Signed)
  Follow up Call-  Call back number 08/21/2012  Post procedure Call Back phone  # [phone]  Permission to leave phone message Yes     Patient questions:  Do you have a fever, pain , or abdominal swelling? no Pain Score  0 *  Have you tolerated food without any problems? yes  Have you been able to return to your normal activities? yes  Do you have any questions about your discharge instructions: Diet   no Medications  no Follow up visit  no  Do you have questions or concerns about your Care? no  Actions: * If pain score is 4 or above: No action needed, pain <4.

## 2012-08-27 ENCOUNTER — Encounter: Payer: Self-pay | Admitting: Gastroenterology

## 2012-09-17 ENCOUNTER — Ambulatory Visit (AMBULATORY_SURGERY_CENTER): Payer: PRIVATE HEALTH INSURANCE

## 2012-09-17 VITALS — Ht 74.0 in | Wt 226.0 lb

## 2012-09-17 DIAGNOSIS — R1310 Dysphagia, unspecified: Secondary | ICD-10-CM

## 2012-09-18 ENCOUNTER — Encounter: Payer: Self-pay | Admitting: Gastroenterology

## 2012-09-27 ENCOUNTER — Encounter: Payer: Self-pay | Admitting: Gastroenterology

## 2012-09-27 ENCOUNTER — Ambulatory Visit (AMBULATORY_SURGERY_CENTER): Payer: PRIVATE HEALTH INSURANCE | Admitting: Gastroenterology

## 2012-09-27 VITALS — BP 117/65 | HR 68 | Temp 96.3°F | Resp 19 | Ht 74.0 in | Wt 226.0 lb

## 2012-09-27 DIAGNOSIS — R1310 Dysphagia, unspecified: Secondary | ICD-10-CM

## 2012-09-27 DIAGNOSIS — K222 Esophageal obstruction: Secondary | ICD-10-CM

## 2012-09-27 MED ORDER — SODIUM CHLORIDE 0.9 % IV SOLN: 500.0000 mL | INTRAVENOUS | Status: DC

## 2012-09-27 NOTE — Progress Notes (Signed)
I was not present in the procedure room when the esophageal dilatation was preformed.  Judithann Sauger, CNA reported to me what was done.  maw

## 2012-09-27 NOTE — Telephone Encounter (Signed)
This chart was opened in error.  No phone call was made to this pt.09-27-12. Maw

## 2012-09-27 NOTE — Op Note (Signed)
[facility] [address] [place], [address]   ENDOSCOPY PROCEDURE REPORT  PATIENT: Alexander, [name]  MR#: [id number] BIRTHDATE: 12/06/1960 , 51  yrs. old GENDER: Male ENDOSCOPIST: Meryl Dare, MD, [facility] PROCEDURE DATE:  09/27/2012 PROCEDURE:   EGD with dilatation over guidewire INDICATIONS:1.  dysphagia.   2.  stricture. MEDICATIONS: MAC sedation, administered by CRNA and propofol (Diprivan) 250mg  IV TOPICAL ANESTHETIC: Cetacaine Spray DESCRIPTION OF PROCEDURE:   After the risks benefits and alternatives of the procedure were thoroughly explained, informed consent was obtained.  The     endoscope was introduced through the mouth and advanced to the second portion of the duodenum. The instrument was slowly withdrawn as the mucosa was carefully examined.  Prior to withdrawal of the scope, the guidwire was placed.  The esophagus was dilated successfully.  The patient was recovered in endoscopy and discharged home in satisfactory condition.    ESOPHAGUS: A stricture was found at the gastroesophageal junction. The lumen measured 11 mm. The stenosis was traversable with the endoscope.  The esophagus was otherwise normal. STOMACH: A small hiatus hernia was found in the cardia. The stomach otherwise appeared normal. DUODENUM: The duodenal mucosa showed no abnormalities in the bulb and second portion of the duodenum.   Dilation was then performed at the gastroesphageal junction Dilator: Savary over guidewire Sizes: 13, 14, 15 mm.  Resistance: minimal. Heme: yes, 14 and 15 mm dilators. Appearance: adequate  COMPLICATIONS: There were no complications.  ENDOSCOPIC IMPRESSION: 1.   Stricture at the gastroesophageal junction 2.   Small hiatus hernia  RECOMMENDATIONS: 1.  anti-reflux regimen 2.  continue PPI 3.  post dilation instructions 4.  EGD/DILATION in about 4 weeks  eSigned:  Meryl Dare, MD, [facility] 09/27/2012 10:04 AM    cc: Leonides Sake,  MD

## 2012-09-27 NOTE — Patient Instructions (Addendum)
Handouts given on Dilation Diet, Hiatal Hernia/Reflux/GERD, Stricture. Resume current medications. Repeat this exam in 4 weeks.   YOU HAD AN ENDOSCOPIC PROCEDURE TODAY AT THE [facility]: Refer to the procedure report that was given to you for any specific questions about what was found during the examination.  If the procedure report does not answer your questions, please call your gastroenterologist to clarify.  If you requested that your care partner not be given the details of your procedure findings, then the procedure report has been included in a sealed envelope for you to review at your convenience later.  YOU SHOULD EXPECT: Some feelings of bloating in the abdomen. Passage of more gas than usual.  Walking can help get rid of the air that was put into your GI tract during the procedure and reduce the bloating. If you had a lower endoscopy (such as a colonoscopy or flexible sigmoidoscopy) you may notice spotting of blood in your stool or on the toilet paper. If you underwent a bowel prep for your procedure, then you may not have a normal bowel movement for a few days.  DIET:  NOTHING by mouth until 11:00 am, then clear liquids for 1 hour, then soft foods rest of today. Resume your regular diet tomorrow. Drink plenty of fluids today, you should avoid alcoholic beverages for 24 hours.  ACTIVITY: Your care partner should take you home directly after the procedure.  You should plan to take it easy, moving slowly for the rest of the day.  You can resume normal activity the day after the procedure however you should NOT DRIVE or use heavy machinery for 24 hours (because of the sedation medicines used during the test).    SYMPTOMS TO REPORT IMMEDIATELY: A gastroenterologist can be reached at any hour.  During normal business hours, 8:30 AM to 5:00 PM Monday through Friday, call [phone].  After hours and on weekends, please call the GI answering service at [phone] who will take a  message and have the physician on call contact you.   Following lower endoscopy (colonoscopy or flexible sigmoidoscopy):  Excessive amounts of blood in the stool  Significant tenderness or worsening of abdominal pains  Swelling of the abdomen that is new, acute  Fever of 100F or higher  Following upper endoscopy (EGD)  Vomiting of blood or coffee ground material  New chest pain or pain under the shoulder blades  Painful or persistently difficult swallowing  New shortness of breath  Fever of 100F or higher  Black, tarry-looking stools  FOLLOW UP: If any biopsies were taken you will be contacted by phone or by letter within the next 1-3 weeks.  Call your gastroenterologist if you have not heard about the biopsies in 3 weeks.  Our staff will call the home number listed on your records the next business day following your procedure to check on you and address any questions or concerns that you may have at that time regarding the information given to you following your procedure. This is a courtesy call and so if there is no answer at the home number and we have not heard from you through the emergency physician on call, we will assume that you have returned to your regular daily activities without incident.  SIGNATURES/CONFIDENTIALITY: You and/or your care partner have signed paperwork which will be entered into your electronic medical record.  These signatures attest to the fact that that the information above on your After Visit Summary has been reviewed  and is understood.  Full responsibility of the confidentiality of this discharge information lies with you and/or your care-partner. 

## 2012-09-27 NOTE — Progress Notes (Signed)
Patient did not experience any of the following events: a burn prior to discharge; a fall within the facility; wrong site/side/patient/procedure/implant event; or a hospital transfer or hospital admission upon discharge from the facility. (G8907) Patient did not have preoperative order for IV antibiotic SSI prophylaxis. (G8918)  

## 2012-09-28 ENCOUNTER — Telehealth: Payer: Self-pay | Admitting: *Deleted

## 2012-09-28 NOTE — Telephone Encounter (Signed)
  Follow up Call-  Call back number 09/27/2012 08/21/2012  Post procedure Call Back phone  # [phone]  Permission to leave phone message Yes Yes    LMOM

## 2012-10-24 ENCOUNTER — Ambulatory Visit (AMBULATORY_SURGERY_CENTER): Payer: PRIVATE HEALTH INSURANCE | Admitting: *Deleted

## 2012-10-24 VITALS — Ht 74.5 in | Wt 228.0 lb

## 2012-10-24 DIAGNOSIS — K222 Esophageal obstruction: Secondary | ICD-10-CM

## 2012-10-24 DIAGNOSIS — R1310 Dysphagia, unspecified: Secondary | ICD-10-CM

## 2012-10-25 ENCOUNTER — Encounter: Payer: Self-pay | Admitting: Gastroenterology

## 2012-10-30 ENCOUNTER — Encounter: Payer: Self-pay | Admitting: Gastroenterology

## 2012-10-30 ENCOUNTER — Ambulatory Visit (AMBULATORY_SURGERY_CENTER): Payer: PRIVATE HEALTH INSURANCE | Admitting: Gastroenterology

## 2012-10-30 VITALS — BP 111/80 | HR 64 | Temp 97.3°F | Resp 14 | Ht 74.0 in | Wt 228.0 lb

## 2012-10-30 DIAGNOSIS — R1310 Dysphagia, unspecified: Secondary | ICD-10-CM

## 2012-10-30 DIAGNOSIS — K222 Esophageal obstruction: Secondary | ICD-10-CM

## 2012-10-30 MED ORDER — SODIUM CHLORIDE 0.9 % IV SOLN: 500.0000 mL | INTRAVENOUS | Status: DC

## 2012-10-30 NOTE — Patient Instructions (Addendum)
YOU HAD AN ENDOSCOPIC PROCEDURE TODAY AT THE [facility]: Refer to the procedure report that was given to you for any specific questions about what was found during the examination.  If the procedure report does not answer your questions, please call your gastroenterologist to clarify.  If you requested that your care partner not be given the details of your procedure findings, then the procedure report has been included in a sealed envelope for you to review at your convenience later.  YOU SHOULD EXPECT: Some feelings of bloating in the abdomen. Passage of more gas than usual.  Walking can help get rid of the air that was put into your GI tract during the procedure and reduce the bloating. If you had a lower endoscopy (such as a colonoscopy or flexible sigmoidoscopy) you may notice spotting of blood in your stool or on the toilet paper. If you underwent a bowel prep for your procedure, then you may not have a normal bowel movement for a few days.  DIET: See Dilation Diet- Drink plenty of fluids but you should avoid alcoholic beverages for 24 hours.  ACTIVITY: Your care partner should take you home directly after the procedure.  You should plan to take it easy, moving slowly for the rest of the day.  You can resume normal activity the day after the procedure however you should NOT DRIVE or use heavy machinery for 24 hours (because of the sedation medicines used during the test).    SYMPTOMS TO REPORT IMMEDIATELY: A gastroenterologist can be reached at any hour.  During normal business hours, 8:30 AM to 5:00 PM Monday through Friday, call [phone].  After hours and on weekends, please call the GI answering service at [phone] who will take a message and have the physician on call contact you.    Following upper endoscopy (EGD)  Vomiting of blood or coffee ground material  New chest pain or pain under the shoulder blades  Painful or persistently difficult swallowing  New  shortness of breath  Fever of 100F or higher  Black, tarry-looking stools  FOLLOW UP: If any biopsies were taken you will be contacted by phone or by letter within the next 1-3 weeks.  Call your gastroenterologist if you have not heard about the biopsies in 3 weeks.  Our staff will call the home number listed on your records the next business day following your procedure to check on you and address any questions or concerns that you may have at that time regarding the information given to you following your procedure. This is a courtesy call and so if there is no answer at the home number and we have not heard from you through the emergency physician on call, we will assume that you have returned to your regular daily activities without incident.  SIGNATURES/CONFIDENTIALITY: You and/or your care partner have signed paperwork which will be entered into your electronic medical record.  These signatures attest to the fact that that the information above on your After Visit Summary has been reviewed and is understood.  Full responsibility of the confidentiality of this discharge information lies with you and/or your care-partner.  Dilation diet, hiatal hernia, stricture, esophagitis-handouts given  Continue nexium long term  Endoscopy with dilation in 4-6 weeks

## 2012-10-30 NOTE — Progress Notes (Signed)
Called to room to assist during endoscopic procedure.  Patient ID and intended procedure confirmed with present staff. Received instructions for my participation in the procedure from the performing physician.  

## 2012-10-30 NOTE — Progress Notes (Signed)
Patient did not experience any of the following events: a burn prior to discharge; a fall within the facility; wrong site/side/patient/procedure/implant event; or a hospital transfer or hospital admission upon discharge from the facility. (G8907) Patient did not have preoperative order for IV antibiotic SSI prophylaxis. (G8918)  

## 2012-10-30 NOTE — Op Note (Signed)
[facility] [address] [place], [address]   ENDOSCOPY PROCEDURE REPORT  PATIENT: Alexander [name], Alexander [name]  MR#: [id number] BIRTHDATE: 1960/07/19 , 51  yrs. old GENDER: Male ENDOSCOPIST: Meryl Dare, MD, [facility] PROCEDURE DATE:  10/30/2012 PROCEDURE:  EGD, diagnostic and Savary dilation of esophagus ASA CLASS:     Class II INDICATIONS:  Dysphagia.   esophageal stricture dilation. MEDICATIONS: MAC sedation, administered by CRNA and propofol (Diprivan) 250mg  IV TOPICAL ANESTHETIC: Cetacaine Spray DESCRIPTION OF PROCEDURE: After the risks benefits and alternatives of the procedure were thoroughly explained, informed consent was obtained.  The LB [id number] endoscope was introduced through the mouth and advanced to the second portion of the duodenum  without limitations.  The instrument was slowly withdrawn as the mucosa was fully examined.  ESOPHAGUS: A stricture was found at the gastroesophageal junction measuring 10 mm.  The stenosis was non traversable with the endoscope.  There was LA Class A esophagitis noted.  The esophagus was otherwise normal. The rest of the EGD was completed after the dilation. STOMACH: The mucosa and folds of the stomach appeared normal. DUODENUM: The duodenal mucosa showed no abnormalities in the bulb and second portion of the duodenum.  Retroflexed views revealed a 4 cm hiatal hernia. A guidewire was placed and the scope was then withdrawn from the patient. 12, 13, 14 and 15 mm Savary dilators were passed over the guidewire with mild resistance to the last 3 dilators. Pt regurgitated heme tinged fluid after the last dilation and the procedure was completed.  COMPLICATIONS: There were no complications.  ENDOSCOPIC IMPRESSION: 1.   Stricture at the gastroesophageal junction; dilation performed 2.   LA Class A esophagitis 3.   Hiatal hernia  RECOMMENDATIONS: 1.  Anti-reflux regimen 2.  Continue PPI long term 3.  Post dilation  instructions 4.  Endoscopy with dilation in 4-6 weeks   eSigned:  Meryl Dare, MD, [facility] 10/30/2012 1:48 PM

## 2012-10-31 ENCOUNTER — Telehealth: Payer: Self-pay

## 2012-10-31 NOTE — Telephone Encounter (Signed)
  Follow up Call-  Call back number 10/30/2012 09/27/2012 08/21/2012  Post procedure Call Back phone  # [phone]  Permission to leave phone message Yes Yes Yes     Patient questions:  Do you have a fever, pain , or abdominal swelling? no Pain Score  0 *  Have you tolerated food without any problems? yes  Have you been able to return to your normal activities? yes  Do you have any questions about your discharge instructions: Diet   no Medications  no Follow up visit  no  Do you have questions or concerns about your Care? no  Actions: * If pain score is 4 or above: No action needed, pain <4.

## 2012-12-05 ENCOUNTER — Ambulatory Visit (AMBULATORY_SURGERY_CENTER): Payer: PRIVATE HEALTH INSURANCE | Admitting: *Deleted

## 2012-12-05 VITALS — Ht 74.5 in | Wt 231.2 lb

## 2012-12-05 DIAGNOSIS — K222 Esophageal obstruction: Secondary | ICD-10-CM

## 2012-12-06 ENCOUNTER — Encounter: Payer: Self-pay | Admitting: Gastroenterology

## 2012-12-11 ENCOUNTER — Encounter: Payer: Self-pay | Admitting: Gastroenterology

## 2012-12-11 ENCOUNTER — Ambulatory Visit (AMBULATORY_SURGERY_CENTER): Payer: PRIVATE HEALTH INSURANCE | Admitting: Gastroenterology

## 2012-12-11 VITALS — BP 108/71 | HR 67 | Resp 18

## 2012-12-11 DIAGNOSIS — R1314 Dysphagia, pharyngoesophageal phase: Secondary | ICD-10-CM

## 2012-12-11 DIAGNOSIS — K222 Esophageal obstruction: Secondary | ICD-10-CM

## 2012-12-11 MED ORDER — SODIUM CHLORIDE 0.9 % IV SOLN: 500.0000 mL | INTRAVENOUS | Status: DC

## 2012-12-11 NOTE — Op Note (Signed)
[facility] [address] [place], [address]   ENDOSCOPY PROCEDURE REPORT  PATIENT: Alexander, [name]  MR#: [id number] BIRTHDATE: [birth year] , 51  yrs. old GENDER: Male ENDOSCOPIST: Meryl Dare, MD, [facility] PROCEDURE DATE:  12/11/2012 PROCEDURE:   EGD with dilatation over guidewire ASA CLASS:   Class II INDICATIONS:stricture and dysphagia. MEDICATIONS: MAC sedation, administered by CRNA and propofol (Diprivan) 150mg  IV TOPICAL ANESTHETIC:   none DESCRIPTION OF PROCEDURE:   After the risks benefits and alternatives of the procedure were thoroughly explained, informed consent was obtained.  The     endoscope was introduced through the mouth  and advanced to the descending duodenum ,     no limitations. The instrument was slowly withdrawn as the mucosa was carefully examined.  ESOPHAGUS: A stricture was found at the gastroesophageal junction. The stenosis was traversable with the endoscope.  It measured 12 mm indiameter. The esophagus was otherwise normal. STOMACH: A hiatus hernia was found in the cardia.   The mucosa and folds of the stomach appeared normal. DUODENUM: The duodenal mucosa showed no abnormalities in the bulb and second portion of the duodenum.  Dilation was then performed at the gastroesphageal junction. Dilator:Savary over guidewire Size:13, 14, 15, 16 mm  Reststance:minimal to the last 3 dilators Heme:yes, minimal on the last 2 dilators  COMPLICATIONS: There were no complications.  ENDOSCOPIC IMPRESSION: 1.   Stricture at the gastroesophageal junction; dilated 2.   Hiatus hernia  RECOMMENDATIONS: 1.  anti-reflux regimen long term 2.  post dilation instructions 3.  continue PPI long term 4.  dilatations PRN 5.  office visit in 1 year  eSigned:  Meryl Dare, MD, [facility] 12/11/2012 1:37 PM   CC: Leonides Sake, MD

## 2012-12-11 NOTE — Patient Instructions (Addendum)
YOU HAD AN ENDOSCOPIC PROCEDURE TODAY AT THE [facility]: Refer to the procedure report that was given to you for any specific questions about what was found during the examination.  If the procedure report does not answer your questions, please call your gastroenterologist to clarify.  If you requested that your care partner not be given the details of your procedure findings, then the procedure report has been included in a sealed envelope for you to review at your convenience later.  YOU SHOULD EXPECT: Some feelings of bloating in the abdomen. Passage of more gas than usual.  Walking can help get rid of the air that was put into your GI tract during the procedure and reduce the bloating. If you had a lower endoscopy (such as a colonoscopy or flexible sigmoidoscopy) you may notice spotting of blood in your stool or on the toilet paper. If you underwent a bowel prep for your procedure, then you may not have a normal bowel movement for a few days.  DIET: FOLLOW DILATION HANDOUT.   ACTIVITY: Your care partner should take you home directly after the procedure.  You should plan to take it easy, moving slowly for the rest of the day.  You can resume normal activity the day after the procedure however you should NOT DRIVE or use heavy machinery for 24 hours (because of the sedation medicines used during the test).    SYMPTOMS TO REPORT IMMEDIATELY: A gastroenterologist can be reached at any hour.  During normal business hours, 8:30 AM to 5:00 PM Monday through Friday, call [phone].  After hours and on weekends, please call the GI answering service at [phone] who will take a message and have the physician on call contact you.    Following upper endoscopy (EGD)  Vomiting of blood or coffee ground material  New chest pain or pain under the shoulder blades  Painful or persistently difficult swallowing  New shortness of breath  Fever of 100F or higher  Black, tarry-looking  stools  FOLLOW UP: If any biopsies were taken you will be contacted by phone or by letter within the next 1-3 weeks.  Call your gastroenterologist if you have not heard about the biopsies in 3 weeks.  Our staff will call the home number listed on your records the next business day following your procedure to check on you and address any questions or concerns that you may have at that time regarding the information given to you following your procedure. This is a courtesy call and so if there is no answer at the home number and we have not heard from you through the emergency physician on call, we will assume that you have returned to your regular daily activities without incident.  SIGNATURES/CONFIDENTIALITY: You and/or your care partner have signed paperwork which will be entered into your electronic medical record.  These signatures attest to the fact that that the information above on your After Visit Summary has been reviewed and is understood.  Full responsibility of the confidentiality of this discharge information lies with you and/or your care-partner.  Resume medications. Dilation diet,stricture and reflux handout given with discharge instructions.

## 2012-12-11 NOTE — Progress Notes (Signed)
Lidocaine-40mg IV prior to Propofol InductionPropofol given over incremental dosagesMild ventilatory obstruction of airway during procedure requiring slight jaw pressure and displacement to create patent airway. Satisfactory SAT's throughout procedure. Discussed with patient post-op about possible jaw discomfort. They respond verbally to understanding. 

## 2012-12-11 NOTE — Progress Notes (Signed)
Patient did not experience any of the following events: a burn prior to discharge; a fall within the facility; wrong site/side/patient/procedure/implant event; or a hospital transfer or hospital admission upon discharge from the facility. (G8907) Patient did not have preoperative order for IV antibiotic SSI prophylaxis. (G8918)  

## 2012-12-11 NOTE — Progress Notes (Signed)
Called to room to assist during endoscopic procedure.  Patient ID and intended procedure confirmed with present staff. Received instructions for my participation in the procedure from the performing physician.  

## 2012-12-12 ENCOUNTER — Telehealth: Payer: Self-pay | Admitting: *Deleted

## 2012-12-12 NOTE — Telephone Encounter (Signed)
No answer left message to call if questions or concerns. 

## 2013-06-25 ENCOUNTER — Encounter: Payer: Self-pay | Admitting: Gastroenterology

## 2013-11-12 ENCOUNTER — Encounter: Payer: Self-pay | Admitting: *Deleted

## 2014-02-22 ENCOUNTER — Encounter: Payer: Self-pay | Admitting: Gastroenterology

## 2014-09-22 ENCOUNTER — Encounter: Payer: Self-pay | Admitting: Gastroenterology

## 2016-04-06 ENCOUNTER — Encounter: Payer: Self-pay | Admitting: Gastroenterology

## 2016-05-26 ENCOUNTER — Ambulatory Visit (AMBULATORY_SURGERY_CENTER): Payer: Self-pay | Admitting: *Deleted

## 2016-05-26 VITALS — Ht 74.0 in | Wt 240.4 lb

## 2016-05-26 DIAGNOSIS — Z86010 Personal history of colonic polyps: Secondary | ICD-10-CM

## 2016-05-26 NOTE — Progress Notes (Signed)
No egg or soy allergies  No anesthesia or intubation problems per pt  No diet medications taken  No hx of sleep apnea or home oxygen used  $15 off coupon of Suprep given

## 2016-06-06 ENCOUNTER — Telehealth: Payer: Self-pay | Admitting: Gastroenterology

## 2016-06-06 DIAGNOSIS — Z86010 Personal history of colonic polyps: Secondary | ICD-10-CM

## 2016-06-06 MED ORDER — NA SULFATE-K SULFATE-MG SULF 17.5-3.13-1.6 GM/177ML PO SOLN
ORAL | 0 refills | Status: DC
Start: 2016-06-06 — End: 2016-06-09

## 2016-06-06 NOTE — Telephone Encounter (Signed)
Sent Suprep into pt's [facility] pharmacy.  LMOM that this has been done

## 2016-06-09 ENCOUNTER — Encounter: Payer: Self-pay | Admitting: Gastroenterology

## 2016-06-09 ENCOUNTER — Ambulatory Visit (AMBULATORY_SURGERY_CENTER): Payer: 59 | Admitting: Gastroenterology

## 2016-06-09 VITALS — BP 126/84 | HR 81 | Temp 97.1°F | Resp 14 | Ht 74.0 in | Wt 240.0 lb

## 2016-06-09 DIAGNOSIS — Z86010 Personal history of colonic polyps: Secondary | ICD-10-CM | POA: Diagnosis not present

## 2016-06-09 DIAGNOSIS — D125 Benign neoplasm of sigmoid colon: Secondary | ICD-10-CM | POA: Diagnosis not present

## 2016-06-09 DIAGNOSIS — K635 Polyp of colon: Secondary | ICD-10-CM | POA: Diagnosis not present

## 2016-06-09 DIAGNOSIS — Z1211 Encounter for screening for malignant neoplasm of colon: Secondary | ICD-10-CM | POA: Diagnosis not present

## 2016-06-09 DIAGNOSIS — D123 Benign neoplasm of transverse colon: Secondary | ICD-10-CM

## 2016-06-09 MED ORDER — SODIUM CHLORIDE 0.9 % IV SOLN: 500.0000 mL | INTRAVENOUS | Status: DC

## 2016-06-09 NOTE — Op Note (Signed)
[name] Procedure Date: 06/09/2016 10:48 AM MRN: [id number] Endoscopist: Ladene Artist , MD Age: 57 Referring MD:  Date of Birth: December 08, 1960 Gender: Male Account #: [id number] Procedure:                Colonoscopy Indications:              Surveillance: Personal history of adenomatous                            polyps on last colonoscopy > 3 years ago Medicines:                Monitored Anesthesia Care Procedure:                Pre-Anesthesia Assessment:                           - Prior to the procedure, a History and Physical                            was performed, and patient medications and                            allergies were reviewed. The patient's tolerance of                            previous anesthesia was also reviewed. The risks                            and benefits of the procedure and the sedation                            options and risks were discussed with the patient.                            All questions were answered, and informed consent                            was obtained. Prior Anticoagulants: The patient has                            taken no previous anticoagulant or antiplatelet                            agents. ASA Grade Assessment: II - A patient with                            mild systemic disease. After reviewing the risks                            and benefits, the patient was deemed in                            satisfactory condition to undergo the procedure.  After obtaining informed consent, the colonoscope                            was passed under direct vision. Throughout the                            procedure, the patient's blood pressure, pulse, and                            oxygen saturations were monitored continuously. The                            Model PCF-H190L [id number]) scope was introduced                            through the anus and  advanced to the the cecum,                            identified by appendiceal orifice and ileocecal                            valve. The ileocecal valve, appendiceal orifice,                            and rectum were photographed. The quality of the                            bowel preparation was good. The colonoscopy was                            performed without difficulty. The patient tolerated                            the procedure well. Scope In: 10:55:01 AM Scope Out: 11:09:28 AM Scope Withdrawal Time: 0 hours 13 minutes 4 seconds  Total Procedure Duration: 0 hours 14 minutes 27 seconds  Findings:                 The perianal and digital rectal examinations were                            normal.                           A 6 mm polyp was found in the transverse colon. The                            polyp was sessile. The polyp was removed with a                            cold snare. Resection and retrieval were complete.                           A 4 mm polyp was found in the sigmoid colon. The  polyp was sessile. The polyp was removed with a                            cold biopsy forceps. Resection and retrieval were                            complete.                           Many small-mouthed diverticula were found in the                            sigmoid colon, descending colon and transverse                            colon. There was no evidence of diverticular                            bleeding.                           Internal hemorrhoids were found during                            retroflexion. The hemorrhoids were small and Grade                            I (internal hemorrhoids that do not prolapse).                           The exam was otherwise without abnormality on                            direct and retroflexion views. Complications:            No immediate complications. Estimated blood loss:                             None. Estimated Blood Loss:     Estimated blood loss: none. Impression:               - One 6 mm polyp in the transverse colon, removed                            with a cold snare. Resected and retrieved.                           - One 4 mm polyp in the sigmoid colon, removed with                            a cold biopsy forceps. Resected and retrieved.                           - Moderate diverticulosis in the sigmoid colon, in  the descending colon and in the transverse colon.                            There was no evidence of diverticular bleeding.                           - Internal hemorrhoids.                           - The examination was otherwise normal on direct                            and retroflexion views. Recommendation:           - Repeat colonoscopy in 3 years for surveillance.                           - Patient has a contact number available for                            emergencies. The signs and symptoms of potential                            delayed complications were discussed with the                            patient. Return to normal activities tomorrow.                            Written discharge instructions were provided to the                            patient.                           - High fiber diet.                           - Continue present medications.                           - Await pathology results. Ladene Artist, MD 06/09/2016 11:13:29 AM This report has been signed electronically.

## 2016-06-09 NOTE — Patient Instructions (Signed)
YOU HAD AN ENDOSCOPIC PROCEDURE TODAY AT [facility]:   Refer to the procedure report that was given to you for any specific questions about what was found during the examination.  If the procedure report does not answer your questions, please call your gastroenterologist to clarify.  If you requested that your care partner not be given the details of your procedure findings, then the procedure report has been included in a sealed envelope for you to review at your convenience later.  YOU SHOULD EXPECT: Some feelings of bloating in the abdomen. Passage of more gas than usual.  Walking can help get rid of the air that was put into your GI tract during the procedure and reduce the bloating. If you had a lower endoscopy (such as a colonoscopy or flexible sigmoidoscopy) you may notice spotting of blood in your stool or on the toilet paper. If you underwent a bowel prep for your procedure, you may not have a normal bowel movement for a few days.  Please Note:  You might notice some irritation and congestion in your nose or some drainage.  This is from the oxygen used during your procedure.  There is no need for concern and it should clear up in a day or so.  SYMPTOMS TO REPORT IMMEDIATELY:   Following lower endoscopy (colonoscopy or flexible sigmoidoscopy):  Excessive amounts of blood in the stool  Significant tenderness or worsening of abdominal pains  Swelling of the abdomen that is new, acute  Fever of 100F or higher  For urgent or emergent issues, a gastroenterologist can be reached at any hour by calling [phone].   DIET:  We do recommend a small meal at first, but then you may proceed to your regular diet.  Drink plenty of fluids but you should avoid alcoholic beverages for 24 hours.  Please read all handouts given to you by your recovery nurse today.  ACTIVITY:  You should plan to take it easy for the rest of today and you should NOT DRIVE or use heavy machinery until  tomorrow (because of the sedation medicines used during the test).    FOLLOW UP: Our staff will call the number listed on your records the next business day following your procedure to check on you and address any questions or concerns that you may have regarding the information given to you following your procedure. If we do not reach you, we will leave a message.  However, if you are feeling well and you are not experiencing any problems, there is no need to return our call.  We will assume that you have returned to your regular daily activities without incident.  If any biopsies were taken you will be contacted by phone or by letter within the next 1-3 weeks.  Please call us at [phone] if you have not heard about the biopsies in 3 weeks.    SIGNATURES/CONFIDENTIALITY: You and/or your care partner have signed paperwork which will be entered into your electronic medical record.  These signatures attest to the fact that that the information above on your After Visit Summary has been reviewed and is understood.  Full responsibility of the confidentiality of this discharge information lies with you and/or your care-partner.  Thank you for letting us take care of your healthcare needs today.

## 2016-06-09 NOTE — Progress Notes (Signed)
Spontaneous respirations throughout. VSS. Resting comfortably. To PACU on room air. Report to  Sara RN. 

## 2016-06-09 NOTE — Progress Notes (Signed)
Called to room to assist during endoscopic procedure.  Patient ID and intended procedure confirmed with present staff. Received instructions for my participation in the procedure from the performing physician.  

## 2016-06-10 ENCOUNTER — Telehealth: Payer: Self-pay | Admitting: *Deleted

## 2016-06-10 NOTE — Telephone Encounter (Signed)
  Follow up Call-  Call back number 06/09/2016  Post procedure Call Back phone  # [phone]  Permission to leave phone message Yes  Some recent data might be hidden     Patient questions:  Do you have a fever, pain , or abdominal swelling? No. Pain Score  0 *  Have you tolerated food without any problems? Yes.    Have you been able to return to your normal activities? Yes.    Do you have any questions about your discharge instructions: Diet   No. Medications  No. Follow up visit  No.  Do you have questions or concerns about your Care? No.  Actions: * If pain score is 4 or above: No action needed, pain <4.

## 2016-06-16 ENCOUNTER — Encounter: Payer: Self-pay | Admitting: Gastroenterology

## 2016-07-05 DIAGNOSIS — J019 Acute sinusitis, unspecified: Secondary | ICD-10-CM | POA: Diagnosis not present

## 2016-07-05 DIAGNOSIS — R0602 Shortness of breath: Secondary | ICD-10-CM | POA: Diagnosis not present

## 2016-07-18 DIAGNOSIS — J0140 Acute pansinusitis, unspecified: Secondary | ICD-10-CM | POA: Diagnosis not present

## 2016-08-25 DIAGNOSIS — H903 Sensorineural hearing loss, bilateral: Secondary | ICD-10-CM | POA: Diagnosis not present

## 2016-10-25 ENCOUNTER — Inpatient Hospital Stay (HOSPITAL_COMMUNITY)
Admission: EM | Admit: 2016-10-25 | Discharge: 2016-10-28 | DRG: 251 | Disposition: A | Discharge status: Home / Self Care | Payer: 59 | Attending: Internal Medicine | Admitting: Internal Medicine

## 2016-10-25 ENCOUNTER — Emergency Department (HOSPITAL_COMMUNITY): Payer: 59

## 2016-10-25 ENCOUNTER — Encounter (HOSPITAL_COMMUNITY): Payer: Self-pay | Admitting: Emergency Medicine

## 2016-10-25 DIAGNOSIS — Z79899 Other long term (current) drug therapy: Secondary | ICD-10-CM

## 2016-10-25 DIAGNOSIS — R0789 Other chest pain: Secondary | ICD-10-CM | POA: Diagnosis not present

## 2016-10-25 DIAGNOSIS — E871 Hypo-osmolality and hyponatremia: Secondary | ICD-10-CM

## 2016-10-25 DIAGNOSIS — D869 Sarcoidosis, unspecified: Secondary | ICD-10-CM | POA: Diagnosis present

## 2016-10-25 DIAGNOSIS — I10 Essential (primary) hypertension: Secondary | ICD-10-CM | POA: Diagnosis present

## 2016-10-25 DIAGNOSIS — R079 Chest pain, unspecified: Secondary | ICD-10-CM | POA: Diagnosis not present

## 2016-10-25 DIAGNOSIS — Z9861 Coronary angioplasty status: Secondary | ICD-10-CM

## 2016-10-25 DIAGNOSIS — I700 Atherosclerosis of aorta: Secondary | ICD-10-CM | POA: Diagnosis present

## 2016-10-25 DIAGNOSIS — I200 Unstable angina: Secondary | ICD-10-CM | POA: Diagnosis not present

## 2016-10-25 DIAGNOSIS — K219 Gastro-esophageal reflux disease without esophagitis: Secondary | ICD-10-CM | POA: Diagnosis present

## 2016-10-25 DIAGNOSIS — I214 Non-ST elevation (NSTEMI) myocardial infarction: Principal | ICD-10-CM

## 2016-10-25 DIAGNOSIS — Z8249 Family history of ischemic heart disease and other diseases of the circulatory system: Secondary | ICD-10-CM

## 2016-10-25 DIAGNOSIS — K760 Fatty (change of) liver, not elsewhere classified: Secondary | ICD-10-CM | POA: Diagnosis present

## 2016-10-25 DIAGNOSIS — I25110 Atherosclerotic heart disease of native coronary artery with unstable angina pectoris: Secondary | ICD-10-CM | POA: Diagnosis present

## 2016-10-25 DIAGNOSIS — Z8719 Personal history of other diseases of the digestive system: Secondary | ICD-10-CM

## 2016-10-25 DIAGNOSIS — Z7982 Long term (current) use of aspirin: Secondary | ICD-10-CM

## 2016-10-25 DIAGNOSIS — K449 Diaphragmatic hernia without obstruction or gangrene: Secondary | ICD-10-CM | POA: Diagnosis present

## 2016-10-25 DIAGNOSIS — K5730 Diverticulosis of large intestine without perforation or abscess without bleeding: Secondary | ICD-10-CM | POA: Diagnosis not present

## 2016-10-25 DIAGNOSIS — Z791 Long term (current) use of non-steroidal anti-inflammatories (NSAID): Secondary | ICD-10-CM

## 2016-10-25 DIAGNOSIS — Z6829 Body mass index (BMI) 29.0-29.9, adult: Secondary | ICD-10-CM

## 2016-10-25 DIAGNOSIS — I959 Hypotension, unspecified: Secondary | ICD-10-CM | POA: Diagnosis present

## 2016-10-25 DIAGNOSIS — E663 Overweight: Secondary | ICD-10-CM | POA: Diagnosis present

## 2016-10-25 HISTORY — DX: Chest pain, unspecified: R07.9

## 2016-10-25 LAB — CBC WITH DIFFERENTIAL/PLATELET
Basophils Absolute: 0.1 10*3/uL (ref 0.0–0.1)
Basophils Relative: 1 %
Eosinophils Absolute: 0.3 10*3/uL (ref 0.0–0.7)
Eosinophils Relative: 4 %
HCT: 45 % (ref 39.0–52.0)
Hemoglobin: 15.5 g/dL (ref 13.0–17.0)
Lymphocytes Relative: 30 %
Lymphs Abs: 2.1 10*3/uL (ref 0.7–4.0)
MCH: 30.6 pg (ref 26.0–34.0)
MCHC: 34.4 g/dL (ref 30.0–36.0)
MCV: 88.9 fL (ref 78.0–100.0)
Monocytes Absolute: 0.4 10*3/uL (ref 0.1–1.0)
Monocytes Relative: 6 %
Neutro Abs: 4.3 10*3/uL (ref 1.7–7.7)
Neutrophils Relative %: 59 %
Platelets: 249 10*3/uL (ref 150–400)
RBC: 5.06 MIL/uL (ref 4.22–5.81)
RDW: 13.8 % (ref 11.5–15.5)
WBC: 7.1 10*3/uL (ref 4.0–10.5)

## 2016-10-25 LAB — COMPREHENSIVE METABOLIC PANEL
ALT: 25 U/L (ref 17–63)
AST: 20 U/L (ref 15–41)
Albumin: 3.8 g/dL (ref 3.5–5.0)
Alkaline Phosphatase: 93 U/L (ref 38–126)
Anion gap: 6 (ref 5–15)
BUN: 11 mg/dL (ref 6–20)
CO2: 24 mmol/L (ref 22–32)
Calcium: 9 mg/dL (ref 8.9–10.3)
Chloride: 104 mmol/L (ref 101–111)
Creatinine, Ser: 0.88 mg/dL (ref 0.61–1.24)
GFR calc Af Amer: >60 mL/min (ref >60)
GFR calc non Af Amer: >60 mL/min (ref >60)
Glucose, Bld: 116 mg/dL — ABNORMAL HIGH (ref 65–99)
Potassium: 3.6 mmol/L (ref 3.5–5.1)
Sodium: 134 mmol/L — ABNORMAL LOW (ref 135–145)
Total Bilirubin: 0.8 mg/dL (ref 0.3–1.2)
Total Protein: 6.4 g/dL — ABNORMAL LOW (ref 6.5–8.1)

## 2016-10-25 LAB — I-STAT TROPONIN, ED: Troponin i, poc: 0.01 ng/mL (ref 0.00–0.08)

## 2016-10-25 MED ORDER — NITROGLYCERIN IN D5W 200-5 MCG/ML-% IV SOLN
10.0000 ug/min | INTRAVENOUS | Status: DC
  Administered 2016-10-25: 10 ug/min via INTRAVENOUS
  Filled 2016-10-25: qty 250

## 2016-10-25 MED ORDER — IOPAMIDOL (ISOVUE-370) INJECTION 76%
INTRAVENOUS | Status: AC
  Administered 2016-10-26: 100 mL
  Filled 2016-10-25: qty 100

## 2016-10-25 NOTE — ED Triage Notes (Signed)
Pt to ED via [facility] with c/o central chest pain which he describes as a tightness.  Pt st's last pm he had pain in upper back then this am had slight pain in chest.  Pt st's pain became worse this pm.  Pt took ASA prior to EMS arrival.  EMS gave pt NTG SL x's 2  With relief of pain.  Pt st's pain was a % on pain scale and after NTG pain is now a #1 on pain scale

## 2016-10-25 NOTE — ED Notes (Signed)
Patient transported to X-ray 

## 2016-10-25 NOTE — ED Provider Notes (Signed)
[facility] DEPT Provider Note   CSN: [id number] Arrival date & time: 10/25/16  2217   By signing my name below, I, Eunice Blase, attest that this documentation has been prepared under the direction and in the presence of Delora Fuel, MD. Electronically signed, Eunice Blase, ED Scribe. 10/25/16. 11:48 PM.   History   Chief Complaint Chief Complaint  Patient presents with  . Chest Pain   The history is provided by the patient and medical records. No language interpreter was used.    Alexander [name] is a 56 y.o. male with h/o HTN, sarcoidosis, GERD and erosive esophagitis, BIB EMS to the Emergency Department with concern for persistent, worsening central upper back pain since last night. He also notes worsening, "indigestion feeling", current 1/10 decreased from 6/10, heavy chest pain starting this morning that is relieved by belching and nitroglycerin, worsened with mild exertion. He states he experienced nausea that lasted ~10 minutes after walking up a flight of stairs  and subsided with rest earlier today. He notes mild relief to back and chest pains after being given 2 doses of nitroglycerin en route via EMS. Pt also states his chest pain radiated into his L jaw earlier today and the pain subsided shortly thereafter. Pt states he took ASA 324 mg x 4 prior to EMS arrival. No tobacco or ETOH use noted. Pt reportedly takes lisinopril to control HTN at home. He reports his mother had a coronary bypass ~61 years of age. 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 No h/o DM noted. No vomit, SOB, diaphoresis or abdominal pain noted. PCP noted for F/U. No other complaints at this time.   Past Medical History:  Diagnosis Date  . Arthritis   . Diverticulosis   . Erosive esophagitis    LA class grade B 2007  . Esophageal stricture   . GERD (gastroesophageal reflux disease)   . Hiatal hernia   . Hypertension   . Sarcoidosis   . 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 Hypertension 06/14/2012  . Sarcoidosis 06/14/2012  . Cough 06/13/2012  . ESOPHAGEAL STRICTURE 10/23/2009  . REFLUX ESOPHAGITIS 04/27/2009  . OTHER DYSPHAGIA 04/27/2009  . Personal history of colonic polyps 04/27/2009    Past Surgical History:  Procedure Laterality Date  . COLONOSCOPY    . HERNIA REPAIR    . TONSILLECTOMY AND ADENOIDECTOMY    . UPPER GASTROINTESTINAL ENDOSCOPY         Home Medications    Prior to Admission medications   Medication Sig Start Date End Date Taking? Authorizing Provider  ALLOPURINOL PO Take by mouth. Unsure of dosage-takes only PRN gout flare    [provider]  aspirin 325 MG tablet Take 325 mg by mouth every 4 (four) hours as needed for pain.    [provider]  esomeprazole (NEXIUM) 40 MG capsule Take 1 capsule (40 mg total) by mouth daily before breakfast. 08/06/12   Ladene Artist, MD  lisinopril (PRINIVIL,ZESTRIL) 10 MG tablet Take 10 mg by mouth daily.  10/22/12   [provider]  testosterone (ANDROGEL) 50 MG/5GM (1%) GEL APPLY TO SKIN IN THE MORNING ONCE A DAY 06/02/16   [provider]    Family History Family History  Problem Relation Age of Onset  . Heart disease Mother   . Stomach cancer Father   . Leukemia Maternal Grandmother   . Colon cancer Neg  Hx   . Diabetes Neg Hx   . Kidney disease Neg Hx   . Liver disease Neg Hx   . Colon polyps Neg Hx   . Rectal cancer Neg Hx     Social History Social History  Substance Use Topics  . Smoking status: Never Smoker  . Smokeless tobacco: Never Used  . Alcohol use No     Allergies   Patient has no known allergies.   Review of Systems Review of Systems  Constitutional: Negative for diaphoresis.  Respiratory: Negative for shortness of breath.   Cardiovascular: Positive for chest pain.  Gastrointestinal: Positive for nausea. Negative for abdominal pain and vomiting.   Musculoskeletal: Positive for back pain.  All other systems reviewed and are negative.    Physical Exam Updated Vital Signs BP 137/81   Pulse 70   Temp 97.4 F (36.3 C) (Oral)   Resp 20   Ht 6\' 2"  (1.88 m)   Wt 242 lb (109.8 kg)   SpO2 96%   BMI 31.07 kg/m   Physical Exam  Constitutional: He is oriented to person, place, and time. He appears well-developed and well-nourished.  HENT:  Head: Normocephalic and atraumatic.  Eyes: EOM are normal. Pupils are equal, round, and reactive to light.  Neck: Normal range of motion. Neck supple. No JVD present.  Cardiovascular: Normal rate, regular rhythm and normal heart sounds.   No murmur heard. Pulmonary/Chest: Effort normal and breath sounds normal. He has no wheezes. He has no rales. He exhibits no tenderness.  Abdominal: Soft. Bowel sounds are normal. He exhibits no distension and no mass. There is no tenderness.  Musculoskeletal: Normal range of motion. He exhibits no edema.  Lymphadenopathy:    He has no cervical adenopathy.  Neurological: He is alert and oriented to person, place, and time. No cranial nerve deficit. He exhibits normal muscle tone. Coordination normal.  Skin: Skin is warm and dry. No rash noted.  Psychiatric: He has a normal mood and affect. His behavior is normal. Judgment and thought content normal.  Nursing note and vitals reviewed.    ED Treatments / Results  DIAGNOSTIC STUDIES: Oxygen Saturation is 96% on RA, NL by my interpretation.    COORDINATION OF CARE: 11:41 PM-Discussed next steps with pt. Pt verbalized understanding and is agreeable with the plan. Will order imaging, consult cardiology and observe pt.   Labs (all labs ordered are listed, but only abnormal results are displayed) Labs Reviewed  COMPREHENSIVE METABOLIC PANEL - Abnormal; Notable for the following:       Result Value   Sodium 134 (*)    Glucose, Bld 116 (*)    Total Protein 6.4 (*)    All other components within normal limits   CBC WITH DIFFERENTIAL/PLATELET  TROPONIN I  TROPONIN I  TROPONIN I  HIV ANTIBODY (ROUTINE TESTING)  I-STAT TROPOININ, ED    EKG  EKG Interpretation  Date/Time:  Tuesday Oct 25 2016 22:29:53 EDT Ventricular Rate:  82 PR Interval:  188 QRS Duration: 86 QT Interval:  366 QTC Calculation: 427 R Axis:   116 Text Interpretation:  Normal sinus rhythm Right axis deviation Possible Right ventricular hypertrophy Abnormal ECG No old tracing to compare Confirmed by Delora Fuel ([id number]) on 10/25/2016 11:26:59 PM       Radiology Dg Chest 2 View  Result Date: 10/25/2016 CLINICAL DATA:  A 71-year-old male with chest pain. EXAM: CHEST  2 VIEW COMPARISON:  Chest CT dated 06/01/2012 FINDINGS: The lungs are clear. There  is no pleural effusion or pneumothorax. Top-normal cardiac size. No acute osseous pathology. IMPRESSION: No active cardiopulmonary disease. Electronically Signed   By: Anner Crete M.D.   On: 10/25/2016 23:44   Ct Angio Chest/abd/pel For Dissection W And/or Wo Contrast  Result Date: 10/26/2016 CLINICAL DATA:  56 year old male with chest and back pain. Evaluate for aortic dissection. EXAM: CT ANGIOGRAPHY CHEST, ABDOMEN AND PELVIS TECHNIQUE: Multidetector CT imaging through the chest, abdomen and pelvis was performed using the standard protocol during bolus administration of intravenous contrast. Multiplanar reconstructed images and MIPs were obtained and reviewed to evaluate the vascular anatomy. CONTRAST:  100 cc Isovue 370 COMPARISON:  Chest radiograph dated 10/25/2016, chest CT dated 06/01/2012 FINDINGS: CTA CHEST FINDINGS Cardiovascular: There is no cardiomegaly or pericardial effusion. Coronary vascular calcification primarily involving the LAD and right coronary artery. The thoracic aorta appears unremarkable. No aneurysmal dilatation or evidence of dissection. The origins of the great vessels of the aortic arch appear patent. The central pulmonary arteries appear unremarkable.  Mediastinum/Nodes: There is no hilar or mediastinal adenopathy. The esophagus is grossly unremarkable. There is lobulated appearance of the fibroid gland. Lungs/Pleura: Lungs are clear. No pleural effusion or pneumothorax. Musculoskeletal: No chest wall abnormality. No acute or significant osseous findings. Review of the MIP images confirms the above findings. CTA ABDOMEN AND PELVIS FINDINGS VASCULAR Aorta: Mild atherosclerotic calcification of the aorta. No aneurysmal dilatation or dissection. Celiac: Patent without evidence of aneurysm, dissection, vasculitis or significant stenosis. SMA: Patent without evidence of aneurysm, dissection, vasculitis or significant stenosis. Renals: Both renal arteries are patent without evidence of aneurysm, dissection, vasculitis, fibromuscular dysplasia or significant stenosis. IMA: Patent without evidence of aneurysm, dissection, vasculitis or significant stenosis. Inflow: Mild atherosclerotic calcification. No aneurysmal dilatation or dissection. Veins: No obvious venous abnormality within the limitations of this arterial phase study. Review of the MIP images confirms the above findings. NON-VASCULAR Hepatobiliary: Apparent mild fatty infiltration of the liver. No intrahepatic biliary ductal dilatation. The gallbladder is unremarkable. Pancreas: Unremarkable. No pancreatic ductal dilatation or surrounding inflammatory changes. Spleen: Normal in size without focal abnormality. Adrenals/Urinary Tract: The adrenal glands are unremarkable. There is an 8 mm right renal exophytic upper pole hypodense lesion, incompletely characterized, but appears stable compared to the study of 2014 and most likely representing a cyst. The kidneys are otherwise unremarkable. The visualized ureters and urinary bladder appear unremarkable as well. Stomach/Bowel: There is extensive sigmoid and colonic diverticulosis without active inflammatory changes. There is a small hiatal hernia. There is a 2.2 cm  distal duodenal diverticulum without active inflammation. There is no evidence of bowel obstruction or active inflammation. Normal appendix. Lymphatic: No adenopathy. Reproductive: The prostate and seminal vesicles are grossly unremarkable. Other: Small fat containing umbilical hernia. Musculoskeletal: No acute or significant osseous findings. Review of the MIP images confirms the above findings. IMPRESSION: 1. No acute intrathoracic, abdominal, or pelvic pathology. No aortic dissection or aneurysm. No CT evidence of pulmonary embolism. 2. Coronary vascular calcification. 3. Colonic diverticulosis. No bowel obstruction or active inflammation. Normal appendix. Electronically Signed   By: Anner Crete M.D.   On: 10/26/2016 01:04    Procedures Procedures (including critical care time) CRITICAL CARE Performed by: [facility] Total critical care time: 50 minutes Critical care time was exclusive of separately billable procedures and treating other patients. Critical care was necessary to treat or prevent imminent or life-threatening deterioration. Critical care was time spent personally by me on the following activities: development of treatment plan with patient and/or surrogate as  well as nursing, discussions with consultants, evaluation of patient's response to treatment, examination of patient, obtaining history from patient or surrogate, ordering and performing treatments and interventions, ordering and review of laboratory studies, ordering and review of radiographic studies, pulse oximetry and re-evaluation of patient's condition.  Medications Ordered in ED Medications  nitroGLYCERIN 50 mg in dextrose 5 % 250 mL (0.2 mg/mL) infusion (12 mcg/min Intravenous Rate/Dose Verify 10/26/16 0045)  pantoprazole (PROTONIX) EC tablet 80 mg (not administered)  lisinopril (PRINIVIL,ZESTRIL) tablet 10 mg (not administered)  acetaminophen (TYLENOL) tablet 650 mg (not administered)  ondansetron (ZOFRAN)  injection 4 mg (not administered)  morphine 4 MG/ML injection 2 mg (not administered)  aspirin tablet 325 mg (not administered)  iopamidol (ISOVUE-370) 76 % injection (100 mLs  Contrast Given 10/26/16 0000)     Initial Impression / Assessment and Plan / ED Course  I have reviewed the triage vital signs and the nursing notes.  Pertinent labs & imaging results that were available during my care of the patient were reviewed by me and considered in my medical decision making (see chart for details).  Patient with interscapular pain which then became anterior chest with radiation to the jaw. Pain worsening with exertion and improved with rest very worrisome for ACS. Initial ECG shows no ST or T changes, initial troponin is negative. Blood pressures were taken in both arms and was 148/95 in the right, 139/94 in the left. He was sent for CT angiogram to rule out aortic dissection. He was started on intravenous nitroglycerin with complete relief of pain. Aspirin was not given because he had taken aspirin at home in greater than recommended doses. CT angiogram showed no evidence of aortic dissection, so arranges are made to admit him to the hospital. Heparin, which had been held until status of potential aortic dissection was clarified is now given. Case is discussed with Dr. Alcario Drought of [facility] who has come to admit the patient.  Final Clinical Impressions(s) / ED Diagnoses   Final diagnoses:  Unstable angina (HCC)    New Prescriptions New Prescriptions   No medications on file   I personally performed the services described in this documentation, which was scribed in my presence. The recorded information has been reviewed and is accurate.       Delora Fuel, MD 96/78/93 [phone]

## 2016-10-25 NOTE — ED Notes (Signed)
Roxanne Mins, MD at bedside

## 2016-10-26 ENCOUNTER — Encounter (HOSPITAL_COMMUNITY): Payer: Self-pay | Admitting: Physician Assistant

## 2016-10-26 ENCOUNTER — Emergency Department (HOSPITAL_COMMUNITY): Payer: 59

## 2016-10-26 DIAGNOSIS — I200 Unstable angina: Secondary | ICD-10-CM

## 2016-10-26 DIAGNOSIS — R079 Chest pain, unspecified: Secondary | ICD-10-CM | POA: Diagnosis not present

## 2016-10-26 DIAGNOSIS — K5730 Diverticulosis of large intestine without perforation or abscess without bleeding: Secondary | ICD-10-CM | POA: Diagnosis not present

## 2016-10-26 HISTORY — DX: Chest pain, unspecified: R07.9

## 2016-10-26 LAB — TROPONIN I
Troponin I: 0.09 ng/mL (ref <0.03)
Troponin I: 0.13 ng/mL (ref <0.03)
Troponin I: 0.11 ng/mL (ref <0.03)

## 2016-10-26 LAB — HEPARIN LEVEL (UNFRACTIONATED)
Heparin Unfractionated: 0.61 IU/mL (ref 0.30–0.70)
Heparin Unfractionated: 0.58 IU/mL (ref 0.30–0.70)

## 2016-10-26 LAB — MRSA PCR SCREENING: MRSA by PCR: NEGATIVE

## 2016-10-26 MED ORDER — HEPARIN (PORCINE) IN NACL 100-0.45 UNIT/ML-% IJ SOLN
1200.0000 [IU]/h | INTRAMUSCULAR | Status: DC
  Administered 2016-10-26 (×2): 1300 [IU]/h via INTRAVENOUS
  Filled 2016-10-26: qty 250

## 2016-10-26 MED ORDER — GI COCKTAIL ~~LOC~~
30.0000 mL | Freq: Three times a day (TID) | ORAL | Status: DC | PRN
  Administered 2016-10-26: 30 mL via ORAL
  Filled 2016-10-26: qty 30

## 2016-10-26 MED ORDER — ASPIRIN EC 81 MG PO TBEC
81.0000 mg | DELAYED_RELEASE_TABLET | Freq: Every day | ORAL | Status: DC
  Administered 2016-10-26: 81 mg via ORAL
  Filled 2016-10-26: qty 1

## 2016-10-26 MED ORDER — PANTOPRAZOLE SODIUM 40 MG PO TBEC
80.0000 mg | DELAYED_RELEASE_TABLET | Freq: Every day | ORAL | Status: DC
  Administered 2016-10-26 – 2016-10-28 (×3): 80 mg via ORAL
  Filled 2016-10-26 (×3): qty 2

## 2016-10-26 MED ORDER — SODIUM CHLORIDE 0.9 % WEIGHT BASED INFUSION
3.0000 mL/kg/h | INTRAVENOUS | Status: DC
  Administered 2016-10-27: 250 mL via INTRAVENOUS
  Administered 2016-10-27: 3 mL/kg/h via INTRAVENOUS

## 2016-10-26 MED ORDER — METOPROLOL TARTRATE 25 MG PO TABS
25.0000 mg | ORAL_TABLET | Freq: Two times a day (BID) | ORAL | Status: DC
  Administered 2016-10-26 – 2016-10-28 (×3): 25 mg via ORAL
  Filled 2016-10-26 (×4): qty 1

## 2016-10-26 MED ORDER — SODIUM CHLORIDE 0.9 % WEIGHT BASED INFUSION: 1.0000 mL/kg/h | INTRAVENOUS | Status: DC

## 2016-10-26 MED ORDER — MORPHINE SULFATE (PF) 4 MG/ML IV SOLN
2.0000 mg | INTRAVENOUS | Status: DC | PRN
  Administered 2016-10-26: 2 mg via INTRAVENOUS
  Filled 2016-10-26: qty 1

## 2016-10-26 MED ORDER — LISINOPRIL 10 MG PO TABS
10.0000 mg | ORAL_TABLET | Freq: Every day | ORAL | Status: DC
  Administered 2016-10-26: 10 mg via ORAL
  Filled 2016-10-26 (×3): qty 1

## 2016-10-26 MED ORDER — HEPARIN BOLUS VIA INFUSION
4000.0000 [IU] | Freq: Once | INTRAVENOUS | Status: AC
  Administered 2016-10-26: 4000 [IU] via INTRAVENOUS
  Filled 2016-10-26: qty 4000

## 2016-10-26 MED ORDER — ASPIRIN 325 MG PO TABS: 325.0000 mg | ORAL_TABLET | Freq: Every day | ORAL | Status: DC

## 2016-10-26 MED ORDER — ATORVASTATIN CALCIUM 80 MG PO TABS
80.0000 mg | ORAL_TABLET | Freq: Every day | ORAL | Status: DC
  Administered 2016-10-26 – 2016-10-27 (×2): 80 mg via ORAL
  Filled 2016-10-26 (×2): qty 1

## 2016-10-26 MED ORDER — ACETAMINOPHEN 325 MG PO TABS
650.0000 mg | ORAL_TABLET | ORAL | Status: DC | PRN
  Administered 2016-10-26: 650 mg via ORAL
  Filled 2016-10-26: qty 2

## 2016-10-26 MED ORDER — ONDANSETRON HCL 4 MG/2ML IJ SOLN
4.0000 mg | Freq: Four times a day (QID) | INTRAMUSCULAR | Status: DC | PRN
  Administered 2016-10-26: 4 mg via INTRAVENOUS
  Filled 2016-10-26: qty 2

## 2016-10-26 NOTE — H&P (Signed)
History and Physical    GARNELL PHENIX IHK:[id number] DOB: 11/21/1960 DOA: 10/25/2016  PCP: Shirline Frees, MD  Patient coming from: Home  I have personally briefly reviewed patient's old medical records in [place]  Chief Complaint: Chest pain  HPI: BRAILEN MACNEAL is a 56 y.o. male with medical history significant of HTN, sarcoidosis, GERD, erosive esophagitis.  Patient presents to the ED with c/o persistent, worsening, central upper back pain and chest pain.  Symptoms onset last night with back pain.  Woke up this morning with "indigestion feeling" and chest pain.  Heavy "pressure" like sensation in central chest.  Worse with activity, walking, walking up stairs etc, better at rest.  Radiation to L jaw.  Patient took ASA 324 x 4 prior to EMS arrival.   ED Course: Not given ASA (secondary to taking more than recommended dose at home).  Chest pain improved in ED with NTG gtt.   Review of Systems: As per HPI otherwise 10 point review of systems negative.   Past Medical History:  Diagnosis Date  . Arthritis   . Diverticulosis   . Erosive esophagitis    LA class grade B 2007  . Esophageal stricture   . GERD (gastroesophageal reflux disease)   . Hiatal hernia   . Hypertension   . Sarcoidosis   . 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 COLONOSCOPY    . HERNIA REPAIR    . TONSILLECTOMY AND ADENOIDECTOMY    . UPPER GASTROINTESTINAL ENDOSCOPY       reports that he has never smoked. He has never used smokeless tobacco. He reports that he does not drink alcohol or use drugs.  No Known Allergies  Family History  Problem Relation Age of Onset  . Heart disease Mother   . Stomach cancer Father   . Leukemia Maternal Grandmother   . Colon cancer Neg Hx   . Diabetes Neg Hx   . Kidney disease Neg Hx   . Liver disease Neg Hx   . Colon polyps Neg Hx   . Rectal cancer Neg Hx      Prior to Admission medications     Medication Sig Start Date End Date Taking? Authorizing Provider  aspirin 325 MG tablet Take 325 mg by mouth every 4 (four) hours as needed for pain.   Yes [provider]  esomeprazole (NEXIUM) 40 MG capsule Take 1 capsule (40 mg total) by mouth daily before breakfast. 08/06/12  Yes Ladene Artist, MD  lisinopril (PRINIVIL,ZESTRIL) 10 MG tablet Take 10 mg by mouth daily.  10/22/12  Yes [provider]    Physical Exam: Vitals:   10/25/16 2300 10/25/16 2345 10/26/16 0015 10/26/16 0130  BP: 120/77 131/89 125/88 121/75  Pulse: 67 63 73 79  Resp: 15 19 17    Temp:      TempSrc:      SpO2: 96% 97% 96% 95%  Weight:      Height:        Constitutional: NAD, calm, comfortable Eyes: PERRL, lids and conjunctivae normal ENMT: Mucous membranes are moist. Posterior pharynx clear of any exudate or lesions.Normal dentition.  Neck: normal, supple, no masses, no thyromegaly Respiratory: clear to auscultation bilaterally, no wheezing, no crackles. Normal respiratory effort. No accessory muscle use.  Cardiovascular: Regular rate and rhythm, no murmurs / rubs / gallops. No extremity edema. 2+ pedal pulses. No carotid bruits.  Abdomen: no tenderness, no masses palpated.  No hepatosplenomegaly. Bowel sounds positive.  Musculoskeletal: no clubbing / cyanosis. No joint deformity upper and lower extremities. Good ROM, no contractures. Normal muscle tone.  Skin: no rashes, lesions, ulcers. No induration Neurologic: CN 2-12 grossly intact. Sensation intact, DTR normal. Strength 5/5 in all 4.  Psychiatric: Normal judgment and insight. Alert and oriented x 3. Normal mood.    Labs on Admission: I have personally reviewed following labs and imaging studies  CBC:  Recent Labs Lab 10/25/16 2221  WBC 7.1  NEUTROABS 4.3  HGB 15.5  HCT 45.0  MCV 88.9  PLT 322   Basic Metabolic Panel:  Recent Labs Lab 10/25/16 2221  NA 134*  K 3.6  CL 104  CO2 24  GLUCOSE 116*  BUN 11  CREATININE  0.88  CALCIUM 9.0   GFR: Estimated Creatinine Clearance: 125 mL/min (by C-G formula based on SCr of 0.88 mg/dL). Liver Function Tests:  Recent Labs Lab 10/25/16 2221  AST 20  ALT 25  ALKPHOS 93  BILITOT 0.8  PROT 6.4*  ALBUMIN 3.8   No results for input(s): LIPASE, AMYLASE in the last 168 hours. No results for input(s): AMMONIA in the last 168 hours. Coagulation Profile: No results for input(s): INR, PROTIME in the last 168 hours. Cardiac Enzymes: No results for input(s): CKTOTAL, CKMB, CKMBINDEX, TROPONINI in the last 168 hours. BNP (last 3 results) No results for input(s): PROBNP in the last 8760 hours. HbA1C: No results for input(s): HGBA1C in the last 72 hours. CBG: No results for input(s): GLUCAP in the last 168 hours. Lipid Profile: No results for input(s): CHOL, HDL, LDLCALC, TRIG, CHOLHDL, LDLDIRECT in the last 72 hours. Thyroid Function Tests: No results for input(s): TSH, T4TOTAL, FREET4, T3FREE, THYROIDAB in the last 72 hours. Anemia Panel: No results for input(s): VITAMINB12, FOLATE, FERRITIN, TIBC, IRON, RETICCTPCT in the last 72 hours. Urine analysis: No results found for: COLORURINE, APPEARANCEUR, LABSPEC, PHURINE, GLUCOSEU, HGBUR, BILIRUBINUR, KETONESUR, PROTEINUR, UROBILINOGEN, NITRITE, LEUKOCYTESUR  Radiological Exams on Admission: Dg Chest 2 View  Result Date: 10/25/2016 CLINICAL DATA:  A 83-year-old male with chest pain. EXAM: CHEST  2 VIEW COMPARISON:  Chest CT dated 06/01/2012 FINDINGS: The lungs are clear. There is no pleural effusion or pneumothorax. Top-normal cardiac size. No acute osseous pathology. IMPRESSION: No active cardiopulmonary disease. Electronically Signed   By: Anner Crete M.D.   On: 10/25/2016 23:44   Ct Angio Chest/abd/pel For Dissection W And/or Wo Contrast  Result Date: 10/26/2016 CLINICAL DATA:  56 year old male with chest and back pain. Evaluate for aortic dissection. EXAM: CT ANGIOGRAPHY CHEST, ABDOMEN AND PELVIS  TECHNIQUE: Multidetector CT imaging through the chest, abdomen and pelvis was performed using the standard protocol during bolus administration of intravenous contrast. Multiplanar reconstructed images and MIPs were obtained and reviewed to evaluate the vascular anatomy. CONTRAST:  100 cc Isovue 370 COMPARISON:  Chest radiograph dated 10/25/2016, chest CT dated 06/01/2012 FINDINGS: CTA CHEST FINDINGS Cardiovascular: There is no cardiomegaly or pericardial effusion. Coronary vascular calcification primarily involving the LAD and right coronary artery. The thoracic aorta appears unremarkable. No aneurysmal dilatation or evidence of dissection. The origins of the great vessels of the aortic arch appear patent. The central pulmonary arteries appear unremarkable. Mediastinum/Nodes: There is no hilar or mediastinal adenopathy. The esophagus is grossly unremarkable. There is lobulated appearance of the fibroid gland. Lungs/Pleura: Lungs are clear. No pleural effusion or pneumothorax. Musculoskeletal: No chest wall abnormality. No acute or significant osseous findings. Review of the MIP images confirms the above findings.  CTA ABDOMEN AND PELVIS FINDINGS VASCULAR Aorta: Mild atherosclerotic calcification of the aorta. No aneurysmal dilatation or dissection. Celiac: Patent without evidence of aneurysm, dissection, vasculitis or significant stenosis. SMA: Patent without evidence of aneurysm, dissection, vasculitis or significant stenosis. Renals: Both renal arteries are patent without evidence of aneurysm, dissection, vasculitis, fibromuscular dysplasia or significant stenosis. IMA: Patent without evidence of aneurysm, dissection, vasculitis or significant stenosis. Inflow: Mild atherosclerotic calcification. No aneurysmal dilatation or dissection. Veins: No obvious venous abnormality within the limitations of this arterial phase study. Review of the MIP images confirms the above findings. NON-VASCULAR Hepatobiliary: Apparent  mild fatty infiltration of the liver. No intrahepatic biliary ductal dilatation. The gallbladder is unremarkable. Pancreas: Unremarkable. No pancreatic ductal dilatation or surrounding inflammatory changes. Spleen: Normal in size without focal abnormality. Adrenals/Urinary Tract: The adrenal glands are unremarkable. There is an 8 mm right renal exophytic upper pole hypodense lesion, incompletely characterized, but appears stable compared to the study of 2014 and most likely representing a cyst. The kidneys are otherwise unremarkable. The visualized ureters and urinary bladder appear unremarkable as well. Stomach/Bowel: There is extensive sigmoid and colonic diverticulosis without active inflammatory changes. There is a small hiatal hernia. There is a 2.2 cm distal duodenal diverticulum without active inflammation. There is no evidence of bowel obstruction or active inflammation. Normal appendix. Lymphatic: No adenopathy. Reproductive: The prostate and seminal vesicles are grossly unremarkable. Other: Small fat containing umbilical hernia. Musculoskeletal: No acute or significant osseous findings. Review of the MIP images confirms the above findings. IMPRESSION: 1. No acute intrathoracic, abdominal, or pelvic pathology. No aortic dissection or aneurysm. No CT evidence of pulmonary embolism. 2. Coronary vascular calcification. 3. Colonic diverticulosis. No bowel obstruction or active inflammation. Normal appendix. Electronically Signed   By: Anner Crete M.D.   On: 10/26/2016 01:04    EKG: Independently reviewed.  Assessment/Plan Principal Problem:   Chest pain, rule out acute myocardial infarction Active Problems:   Hypertension    1. CP - 1. CP obs pathway 2. Serial trops 3. Heparin gtt 4. Holding off on further ASA since he took 4x the recommended dose already PTA 5. Tele monitor 6. NTG gtt 7. Plan to call cards in AM needs rule out of coronary 1. If he does rule out coronary then needs GI  follow up given his extensive history of upper GI issues (gerd, dysphagia, NSAID use/overuse, esophageal stricture, erosive esophagitis, etc). 2. HTN - continue lisinopril  DVT prophylaxis: heparin gtt Code Status: Full Family Communication: Wife at bedside Disposition Plan: Home after admit Consults called: None, call cards in AM Admission status: Place in obs   [place], [facility] Pager [phone]  If 7AM-7PM, please contact day team taking care of patient [email or web address] Password [email or web address]  10/26/2016, 1:54 AM

## 2016-10-26 NOTE — Progress Notes (Signed)
ANTICOAGULATION CONSULT NOTE - Follow Up Consult  Pharmacy Consult for Heparin Indication: chest pain/ACS  No Known Allergies  Patient Measurements: Height: 6' 2.5" (189.2 cm) Weight: 234 lb 9.1 oz (106.4 kg) IBW/kg (Calculated) : 83.35 Heparin Dosing Weight: 100kg  Vital Signs: Temp: 97.5 F (36.4 C) (05/30 1155) Temp Source: Oral (05/30 1155) BP: 114/82 (05/30 1155) Pulse Rate: 72 (05/30 1155)  Labs:  Recent Labs  10/25/16 2221 10/26/16 0141 10/26/16 0802 10/26/16 1237 10/26/16 1554  HGB 15.5  --   --   --   --   HCT 45.0  --   --   --   --   PLT 249  --   --   --   --   HEPARINUNFRC  --   --  0.61  --  0.58  CREATININE 0.88  --   --   --   --   TROPONINI  --  0.09* 0.13* 0.11*  --     Estimated Creatinine Clearance: 124.2 mL/min (by C-G formula based on SCr of 0.88 mg/dL).   Medications:  Heparin @ 1300 units/hr  Assessment: Alexander [name] started on heparin for NSTEMI. Confirmatory heparin level this afternoon remains therapeutic at 0.58 units/mL. No bleeding noted.  Goal of Therapy:  Heparin level 0.3-0.7 units/ml Monitor platelets by anticoagulation protocol: Yes   Plan:  - Continue heparin at 1300 units/hr - Daily heparin level and CBC  Refujio Haymer D. Dong Nimmons, PharmD, BCPS Clinical Pharmacist Pager: [phone] 10/26/2016 4:30 PM

## 2016-10-26 NOTE — Consult Note (Signed)
CARDIOLOGY CONSULT NOTE   Patient ID: Alexander [name] MRN: [id number] DOB/AGE: 56-08-1960 56 y.o.  Admit date: 10/25/2016  Primary Physician   Shirline Frees, MD Primary Cardiologist   New Reason for Consultation   Chest pain Requesting MD: Dr Thereasa Solo  HPI: Alexander [name] is a 56 y.o. male with hx of GERD w/ erosive esophagitis, recurrent strictures requiring dilatation, OA, HTN, sarcoid, colon adenoma, FH premature CAD.   Pt admitted 05/29 with chest pain.  Pt is being seen today for the evaluation of chest pain at the request of Dr Thereasa Solo.  Pt was in usual state of health till 05/28. He woke with back pain that night. He took ASA, which helped. It was 5-6/10, dropped to a 2 and he was able to sleep. He woke with back and chest pain, 5/10. He took ASA 325 mg x 4, which helped.   In the afternoon, he went to [place] to see a client, pain was then 2/10. He had to climb stairs and the back/chest pain got worse, it got to a 6-7/10. He was SOB and a little nauseated. He was concerned. He was belching frequently and that helped his symptoms, down to a 2/10. He got back to work, pain was ok. When he went to a friend's house, he felt worse, drove home and then called 911. At that time, pain was 7/10 and radiated into his neck. He took ASA 325 mg, EMS gave him SL NTG x 2, the second one dropped the pain to a 1-2/10.  The pain has been a heaviness, he still has both, feels the nitro keeps the level down. His pain level has not been a 0/10 since 05/28 pm. When he climbed the stairs, and when he was moving some equipment, he was SOB and nauseated as well.    He has never had pain like this before. On 05/27, he walked in the park for about 2 miles, carrying his granddaughter at times, no sx.    Past Medical History:  Diagnosis Date  . Arthritis   . Chest pain with moderate risk for cardiac etiology 10/26/2016  . Diverticulosis   . Erosive esophagitis    LA class grade B 2007  . Esophageal  stricture   . GERD (gastroesophageal reflux disease)   . Hiatal hernia   . Hypertension   . Sarcoidosis   . 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 COLONOSCOPY    . HERNIA REPAIR    . TONSILLECTOMY AND ADENOIDECTOMY    . UPPER GASTROINTESTINAL ENDOSCOPY      No Known Allergies  I have reviewed the patient's current medications . lisinopril  10 mg Oral Daily  . pantoprazole  80 mg Oral Daily   . heparin 1,300 Units/hr (10/26/16 1100)  . nitroGLYCERIN 20 mcg/min (10/26/16 1310)   acetaminophen, morphine injection, ondansetron (ZOFRAN) IV  Prior to Admission medications   Medication Sig Start Date End Date Taking? Authorizing Provider  aspirin 325 MG tablet Take 325 mg by mouth every 4 (four) hours as needed for pain.   Yes [provider]  esomeprazole (NEXIUM) 40 MG capsule Take 1 capsule (40 mg total) by mouth daily before breakfast. 08/06/12  Yes Ladene Artist, MD  lisinopril (PRINIVIL,ZESTRIL) 10 MG tablet Take 10 mg by mouth daily.  10/22/12  Yes [provider]     Social History   Social History  .  Marital status: Married    Spouse name: N/A  . Number of children: N/A  . Years of education: N/A   Occupational History  . 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 Smoking status: Never Smoker  . Smokeless tobacco: Never Used  . Alcohol use No  . Drug use: No  . Sexual activity: Not on file   Other Topics Concern  . Not on file   Social History Narrative   Lives with wife in [place], near [place].    Family Status  Relation Status  . Mother Alive  . Father Deceased  . MGM (Not Specified)  . Mat Aunt Deceased  . Neg Hx (Not Specified)   Family History  Problem Relation Age of Onset  . Heart disease Mother        CABG age 61, born 35  . Stomach cancer Father 75  . Leukemia Maternal Grandmother   . Heart attack Maternal Aunt 35  . Colon cancer Neg  Hx   . Diabetes Neg Hx   . Kidney disease Neg Hx   . Liver disease Neg Hx   . Colon polyps Neg Hx   . Rectal cancer Neg Hx      ROS:  Full 14 point review of systems complete and found to be negative unless listed above.  Physical Exam: Blood pressure 114/82, pulse 72, temperature 97.5 F (36.4 C), temperature source Oral, resp. rate 19, height 6' 2.5" (1.892 m), weight 234 lb 9.1 oz (106.4 kg), SpO2 95 %.  General: Well developed, well nourished, male in no acute distress Head: Eyes PERRLA, No xanthomas.   Normocephalic and atraumatic, oropharynx without edema or exudate. Dentition: good Lungs: clear bilaterally Heart: HRRR S1 S2, no rub/gallop, no murmur. pulses are 2+ all 4 extrem.   Neck: No carotid bruits. No lymphadenopathy.  JVD not elevated Abdomen: Bowel sounds present, abdomen soft and non-tender without masses or hernias noted. Msk:  No spine or cva tenderness. No weakness, no joint deformities or effusions. Extremities: No clubbing or cyanosis. No edema.  Neuro: Alert and oriented X 3. No focal deficits noted. Psych:  Good affect, responds appropriately Skin: No rashes or lesions noted.  Labs:   Lab Results  Component Value Date   WBC 7.1 10/25/2016   HGB 15.5 10/25/2016   HCT 45.0 10/25/2016   MCV 88.9 10/25/2016   PLT 249 10/25/2016    Recent Labs Lab 10/25/16 2221  NA 134*  K 3.6  CL 104  CO2 24  BUN 11  CREATININE 0.88  CALCIUM 9.0  PROT 6.4*  BILITOT 0.8  ALKPHOS 93  ALT 25  AST 20  GLUCOSE 116*  ALBUMIN 3.8    Recent Labs  10/26/16 0141 10/26/16 0802 10/26/16 1237  TROPONINI 0.09* 0.13* 0.11*    Recent Labs  10/25/16 2231  TROPIPOC 0.01    Echo: n/a  ECG:  SR, R axis deviation, ?RVH  Cath: n/a  Radiology:  Dg Chest 2 View Result Date: 10/25/2016 CLINICAL DATA:  A 73-year-old male with chest pain. EXAM: CHEST  2 VIEW COMPARISON:  Chest CT dated 06/01/2012 FINDINGS: The lungs are clear. There is no pleural effusion or  pneumothorax. Top-normal cardiac size. No acute osseous pathology. IMPRESSION: No active cardiopulmonary disease. Electronically Signed   By: Anner Crete M.D.   On: 10/25/2016 23:44   Ct Angio Chest/abd/pel For Dissection W And/or Wo Contrast Result Date: 10/26/2016 CLINICAL DATA:  56 year old male with chest and back  pain. Evaluate for aortic dissection. EXAM: CT ANGIOGRAPHY CHEST, ABDOMEN AND PELVIS TECHNIQUE: Multidetector CT imaging through the chest, abdomen and pelvis was performed using the standard protocol during bolus administration of intravenous contrast. Multiplanar reconstructed images and MIPs were obtained and reviewed to evaluate the vascular anatomy. CONTRAST:  100 cc Isovue 370 COMPARISON:  Chest radiograph dated 10/25/2016, chest CT dated 06/01/2012 FINDINGS: CTA CHEST FINDINGS Cardiovascular: There is no cardiomegaly or pericardial effusion. Coronary vascular calcification primarily involving the LAD and right coronary artery. The thoracic aorta appears unremarkable. No aneurysmal dilatation or evidence of dissection. The origins of the great vessels of the aortic arch appear patent. The central pulmonary arteries appear unremarkable. Mediastinum/Nodes: There is no hilar or mediastinal adenopathy. The esophagus is grossly unremarkable. There is lobulated appearance of the fibroid gland. Lungs/Pleura: Lungs are clear. No pleural effusion or pneumothorax. Musculoskeletal: No chest wall abnormality. No acute or significant osseous findings. Review of the MIP images confirms the above findings. CTA ABDOMEN AND PELVIS FINDINGS VASCULAR Aorta: Mild atherosclerotic calcification of the aorta. No aneurysmal dilatation or dissection. Celiac: Patent without evidence of aneurysm, dissection, vasculitis or significant stenosis. SMA: Patent without evidence of aneurysm, dissection, vasculitis or significant stenosis. Renals: Both renal arteries are patent without evidence of aneurysm, dissection,  vasculitis, fibromuscular dysplasia or significant stenosis. IMA: Patent without evidence of aneurysm, dissection, vasculitis or significant stenosis. Inflow: Mild atherosclerotic calcification. No aneurysmal dilatation or dissection. Veins: No obvious venous abnormality within the limitations of this arterial phase study. Review of the MIP images confirms the above findings. NON-VASCULAR Hepatobiliary: Apparent mild fatty infiltration of the liver. No intrahepatic biliary ductal dilatation. The gallbladder is unremarkable. Pancreas: Unremarkable. No pancreatic ductal dilatation or surrounding inflammatory changes. Spleen: Normal in size without focal abnormality. Adrenals/Urinary Tract: The adrenal glands are unremarkable. There is an 8 mm right renal exophytic upper pole hypodense lesion, incompletely characterized, but appears stable compared to the study of 2014 and most likely representing a cyst. The kidneys are otherwise unremarkable. The visualized ureters and urinary bladder appear unremarkable as well. Stomach/Bowel: There is extensive sigmoid and colonic diverticulosis without active inflammatory changes. There is a small hiatal hernia. There is a 2.2 cm distal duodenal diverticulum without active inflammation. There is no evidence of bowel obstruction or active inflammation. Normal appendix. Lymphatic: No adenopathy. Reproductive: The prostate and seminal vesicles are grossly unremarkable. Other: Small fat containing umbilical hernia. Musculoskeletal: No acute or significant osseous findings. Review of the MIP images confirms the above findings. IMPRESSION: 1. No acute intrathoracic, abdominal, or pelvic pathology. No aortic dissection or aneurysm. No CT evidence of pulmonary embolism. 2. Coronary vascular calcification. 3. Colonic diverticulosis. No bowel obstruction or active inflammation. Normal appendix. Electronically Signed   By: Anner Crete M.D.   On: 10/26/2016 01:04    ASSESSMENT AND  PLAN:   The patient was seen today by Dr Angelena Form, the patient evaluated and the data reviewed.  Principal Problem:   Chest pain, rule out acute myocardial infarction - sx have been going on for almost 48 hrs  - However, FH CAD is very significant and sx improved w/ SL NTG, ECG is abnl (though not clearly ischemic) and ez are elevated. - cath may be best option, - The risks and benefits of a cardiac catheterization including, but not limited to, death, stroke, MI, kidney damage and bleeding were discussed with the patient and his wife who indicate understanding and agree to proceed.   Active Problems:   Hypertension - BP controlled  on current rx   Signed: Lenoard Aden 10/26/2016 2:37 PM Beeper [phone]  I have personally seen and examined this patient with Rosaria Ferries, PA-C. I agree with the assessment and plan as outlined above. He has a personal history of HTN and strong FH of CAD. He is now presenting with chest pain worrisome for unstable angina. Troponin is mildly elevated at 0.13. EKG shows no injury current. I have personally reviewed his EKG and it shows sinus rhythm. He has mild chest pain at this time.  My exam shows: General: Well developed, well nourished, NAD  HEENT: OP clear, mucus membranes moist  SKIN: warm, dry. No rashes. Neuro: No focal deficits  Musculoskeletal: Muscle strength 5/5 all ext  Psychiatric: Mood and affect normal  Neck: No JVD, no carotid bruits, no thyromegaly, no lymphadenopathy.  Lungs:Clear bilaterally, no wheezes, rhonci, crackles Cardiovascular: Regular rate and rhythm. No murmurs, gallops or rubs. Abdomen:Soft. Bowel sounds present. Non-tender.  Extremities: No lower extremity edema. Pulses are 2 + in the bilateral DP/PT. Plan:  1. Unstable angina: He has classic story of UA. Troponin mildly elevated. Plan cardiac cath in the am. NPO at midnight. Risks and benefits of cath reviewed with pt. Continue IV NTG, IV heparin. Start ASA,  statin and beta blocker.

## 2016-10-26 NOTE — Progress Notes (Signed)
ANTICOAGULATION CONSULT NOTE - Initial Consult  Pharmacy Consult for Heparin Indication: chest pain/ACS  No Known Allergies  Patient Measurements: Height: 6\' 2"  (188 cm) Weight: 242 lb (109.8 kg) IBW/kg (Calculated) : 82.2 Heparin Dosing Weight: 100 kg  Vital Signs: Temp: 97.4 F (36.3 C) (05/29 2220) Temp Source: Oral (05/29 2220) BP: 121/75 (05/30 0130) Pulse Rate: 79 (05/30 0130)  Labs:  Recent Labs  10/25/16 2221  HGB 15.5  HCT 45.0  PLT 249  CREATININE 0.88    Estimated Creatinine Clearance: 125 mL/min (by C-G formula based on SCr of 0.88 mg/dL).   Medical History: Past Medical History:  Diagnosis Date  . Arthritis   . Diverticulosis   . Erosive esophagitis    LA class grade B 2007  . Esophageal stricture   . GERD (gastroesophageal reflux disease)   . Hiatal hernia   . Hypertension   . Sarcoidosis   . 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 0.9 %  sodium chloride infusion  500 mL Intravenous Continuous Ladene Artist, MD       Current Outpatient Prescriptions on File Prior to Encounter  Medication Sig Dispense Refill  . aspirin 325 MG tablet Take 325 mg by mouth every 4 (four) hours as needed for pain.    Marland Kitchen esomeprazole (NEXIUM) 40 MG capsule Take 1 capsule (40 mg total) by mouth daily before breakfast. 30 capsule 11  . lisinopril (PRINIVIL,ZESTRIL) 10 MG tablet Take 10 mg by mouth daily.       Assessment: 56 y.o. male with chest pain for heparin  Goal of Therapy:  Heparin level 0.3-0.7 units/ml Monitor platelets by anticoagulation protocol: Yes   Plan:  Heparin 4000 units IV bolus, then start heparin 1300 units/hr Check heparin level in 6 hours.   Norleen Xie, Bronson Curb 10/26/2016,2:01 AM

## 2016-10-26 NOTE — ED Notes (Signed)
Heparin verified with Jenny Reichmann, RN

## 2016-10-26 NOTE — Progress Notes (Signed)
ANTICOAGULATION CONSULT NOTE - Follow Up Consult  Pharmacy Consult for Heparin Indication: chest pain/ACS  No Known Allergies  Patient Measurements: Height: 6' 2.5" (189.2 cm) Weight: 234 lb 9.1 oz (106.4 kg) IBW/kg (Calculated) : 83.35 Heparin Dosing Weight: 100kg  Vital Signs: Temp: 97.5 F (36.4 C) (05/30 0841) Temp Source: Oral (05/30 0841) BP: 120/86 (05/30 0900) Pulse Rate: 69 (05/30 0900)  Labs:  Recent Labs  10/25/16 2221 10/26/16 0141 10/26/16 0802  HGB 15.5  --   --   HCT 45.0  --   --   PLT 249  --   --   HEPARINUNFRC  --   --  0.61  CREATININE 0.88  --   --   TROPONINI  --  0.09* 0.13*    Estimated Creatinine Clearance: 124.2 mL/min (by C-G formula based on SCr of 0.88 mg/dL).   Medications:  Heparin @ 1300 units/hr  Assessment: 55yom started on heparin earlier today for NSTEMI. Initial heparin level is therapeutic at 0.61. No bleeding.  Goal of Therapy:  Heparin level 0.3-0.7 units/ml Monitor platelets by anticoagulation protocol: Yes   Plan:  1) Continue heparin at 1300 units/hr 2) Check heparin level in 6 hours to confirm  Deboraha Sprang 10/26/2016,10:02 AM

## 2016-10-26 NOTE — ED Notes (Signed)
Alcario Drought, MD aware of Troponin

## 2016-10-26 NOTE — Progress Notes (Signed)
DISH  SNK:[id number] DOB: 02/13/1961 DOA: 10/25/2016 PCP: Shirline Frees, MD    Brief Narrative:  56 y.o. male with history of HTN, sarcoidosis, and GERD w/ erosive esophagitis who presented to the ED with c/o persistent, worsening, central upper back pain and chest pain for ~24hrs described as a "heavy pressure" like sensation in the central chest. The pain radiated to the L jaw and was worse with activity/better at rest.  Radiation to L jaw.    Subjective: Pt is seen for a f/u visit.  Patient describes many months of severe dyspnea with even modest exertion that has been worsening.  He further describes the acute onset of substernal chest pressure described as a "heavy squeezing sensation" which began approximately 36 hours ago.  It has come and gone but is never completely resolved since its onset.  It is worse with exertion and appears to improve some with rest.  He is overweight, has hypertension, and his mother required a 4 vessel CABG at the age of 38.  He is not diabetic, does not smoke, and reports that his lipids have been normal on an annual basis.  Assessment & Plan:  Chest Pain Troponin only very mildly elevated - EKG w/o acute findings - symptoms of progressive exercise intolerance and the character of his pain make me concerned about the possibility of coronary artery disease - I'm also worried given the fact that his mother had early onset coronary disease - I have asked cardiology to see him as I suspect he will at very least need a nuclear medicine stress test but quite possibly would even benefit from a cardiac cath  [facility] / GERD / erosive esophagitis   HTN  Sarcoidosis   DVT prophylaxis: heparin gtt  Code Status: FULL CODE Family Communication: Spoke with wife at bedside Disposition Plan: SDU  Consultants:  none  Procedures: none  Antimicrobials:  none   Objective: Blood pressure 114/82, pulse 72, temperature 97.5  F (36.4 C), temperature source Oral, resp. rate 19, height 6' 2.5" (1.892 m), weight 106.4 kg (234 lb 9.1 oz), SpO2 95 %.  Intake/Output Summary (Last 24 hours) at 10/26/16 1303 Last data filed at 10/26/16 1100  Gross per 24 hour  Intake            97.45 ml  Output              500 ml  Net          -402.55 ml   Filed Weights   10/25/16 2223 10/26/16 0506  Weight: 109.8 kg (242 lb) 106.4 kg (234 lb 9.1 oz)    Examination: Pt was seen for a f/u visit.    CBC:  Recent Labs Lab 10/25/16 2221  WBC 7.1  NEUTROABS 4.3  HGB 15.5  HCT 45.0  MCV 88.9  PLT 937   Basic Metabolic Panel:  Recent Labs Lab 10/25/16 2221  NA 134*  K 3.6  CL 104  CO2 24  GLUCOSE 116*  BUN 11  CREATININE 0.88  CALCIUM 9.0   GFR: Estimated Creatinine Clearance: 124.2 mL/min (by C-G formula based on SCr of 0.88 mg/dL).  Liver Function Tests:  Recent Labs Lab 10/25/16 2221  AST 20  ALT 25  ALKPHOS 93  BILITOT 0.8  PROT 6.4*  ALBUMIN 3.8   Cardiac Enzymes:  Recent Labs Lab 10/26/16 0141 10/26/16 0802  TROPONINI 0.09* 0.13*    Scheduled Meds: . lisinopril  10  mg Oral Daily  . pantoprazole  80 mg Oral Daily   Continuous Infusions: . heparin 1,300 Units/hr (10/26/16 1100)  . nitroGLYCERIN 15 mcg/min (10/26/16 1000)     LOS: 0 days   Time spent: No Charge  Cherene Altes, MD [facility] Office  [phone] Pager - Text Page per Amion as per below:  On-Call/Text Page:      [email or web address]      password [email or web address]  If 7PM-7AM, please contact night-coverage [email or web address] Password [email or web address] 10/26/2016, 1:03 PM

## 2016-10-27 ENCOUNTER — Encounter (HOSPITAL_COMMUNITY)
Admission: EM | Disposition: A | Discharge status: Home / Self Care | Payer: Self-pay | Source: Home / Self Care | Attending: Internal Medicine

## 2016-10-27 ENCOUNTER — Encounter (HOSPITAL_COMMUNITY): Payer: Self-pay | Admitting: Interventional Cardiology

## 2016-10-27 DIAGNOSIS — Z791 Long term (current) use of non-steroidal anti-inflammatories (NSAID): Secondary | ICD-10-CM | POA: Diagnosis not present

## 2016-10-27 DIAGNOSIS — I2510 Atherosclerotic heart disease of native coronary artery without angina pectoris: Secondary | ICD-10-CM | POA: Diagnosis not present

## 2016-10-27 DIAGNOSIS — I214 Non-ST elevation (NSTEMI) myocardial infarction: Secondary | ICD-10-CM | POA: Diagnosis not present

## 2016-10-27 DIAGNOSIS — I10 Essential (primary) hypertension: Secondary | ICD-10-CM | POA: Diagnosis not present

## 2016-10-27 DIAGNOSIS — Z79899 Other long term (current) drug therapy: Secondary | ICD-10-CM | POA: Diagnosis not present

## 2016-10-27 DIAGNOSIS — R079 Chest pain, unspecified: Secondary | ICD-10-CM | POA: Diagnosis not present

## 2016-10-27 DIAGNOSIS — K449 Diaphragmatic hernia without obstruction or gangrene: Secondary | ICD-10-CM | POA: Diagnosis present

## 2016-10-27 DIAGNOSIS — I700 Atherosclerosis of aorta: Secondary | ICD-10-CM | POA: Diagnosis present

## 2016-10-27 DIAGNOSIS — Z9861 Coronary angioplasty status: Secondary | ICD-10-CM | POA: Diagnosis not present

## 2016-10-27 DIAGNOSIS — Z7982 Long term (current) use of aspirin: Secondary | ICD-10-CM | POA: Diagnosis not present

## 2016-10-27 DIAGNOSIS — I959 Hypotension, unspecified: Secondary | ICD-10-CM | POA: Diagnosis not present

## 2016-10-27 DIAGNOSIS — Z6829 Body mass index (BMI) 29.0-29.9, adult: Secondary | ICD-10-CM | POA: Diagnosis not present

## 2016-10-27 DIAGNOSIS — E871 Hypo-osmolality and hyponatremia: Secondary | ICD-10-CM

## 2016-10-27 DIAGNOSIS — K219 Gastro-esophageal reflux disease without esophagitis: Secondary | ICD-10-CM | POA: Diagnosis not present

## 2016-10-27 DIAGNOSIS — E783 Hyperchylomicronemia: Secondary | ICD-10-CM | POA: Diagnosis not present

## 2016-10-27 DIAGNOSIS — I200 Unstable angina: Secondary | ICD-10-CM | POA: Diagnosis not present

## 2016-10-27 DIAGNOSIS — Z8249 Family history of ischemic heart disease and other diseases of the circulatory system: Secondary | ICD-10-CM | POA: Diagnosis not present

## 2016-10-27 DIAGNOSIS — I25110 Atherosclerotic heart disease of native coronary artery with unstable angina pectoris: Secondary | ICD-10-CM | POA: Diagnosis not present

## 2016-10-27 DIAGNOSIS — Z8719 Personal history of other diseases of the digestive system: Secondary | ICD-10-CM | POA: Diagnosis not present

## 2016-10-27 DIAGNOSIS — E663 Overweight: Secondary | ICD-10-CM | POA: Diagnosis not present

## 2016-10-27 DIAGNOSIS — D869 Sarcoidosis, unspecified: Secondary | ICD-10-CM | POA: Diagnosis present

## 2016-10-27 DIAGNOSIS — K760 Fatty (change of) liver, not elsewhere classified: Secondary | ICD-10-CM | POA: Diagnosis present

## 2016-10-27 HISTORY — PX: CORONARY BALLOON ANGIOPLASTY: CATH118233

## 2016-10-27 HISTORY — PX: LEFT HEART CATH AND CORONARY ANGIOGRAPHY: CATH118249

## 2016-10-27 LAB — CBC
HCT: 44.6 % (ref 39.0–52.0)
Hemoglobin: 15.1 g/dL (ref 13.0–17.0)
MCH: 30.1 pg (ref 26.0–34.0)
MCHC: 33.9 g/dL (ref 30.0–36.0)
MCV: 89 fL (ref 78.0–100.0)
Platelets: 216 10*3/uL (ref 150–400)
RBC: 5.01 MIL/uL (ref 4.22–5.81)
RDW: 13.7 % (ref 11.5–15.5)
WBC: 6.6 10*3/uL (ref 4.0–10.5)

## 2016-10-27 LAB — BASIC METABOLIC PANEL
Anion gap: 12 (ref 5–15)
BUN: 9 mg/dL (ref 6–20)
CO2: 20 mmol/L — ABNORMAL LOW (ref 22–32)
Calcium: 8.9 mg/dL (ref 8.9–10.3)
Chloride: 107 mmol/L (ref 101–111)
Creatinine, Ser: 0.84 mg/dL (ref 0.61–1.24)
GFR calc Af Amer: >60 mL/min (ref >60)
GFR calc non Af Amer: >60 mL/min (ref >60)
Glucose, Bld: 77 mg/dL (ref 65–99)
Potassium: 3.8 mmol/L (ref 3.5–5.1)
Sodium: 139 mmol/L (ref 135–145)

## 2016-10-27 LAB — HEPARIN LEVEL (UNFRACTIONATED): Heparin Unfractionated: 0.72 IU/mL — ABNORMAL HIGH (ref 0.30–0.70)

## 2016-10-27 LAB — POCT ACTIVATED CLOTTING TIME: Activated Clotting Time: 401 seconds

## 2016-10-27 LAB — PROTIME-INR
INR: 1.06
Prothrombin Time: 13.8 seconds (ref 11.4–15.2)

## 2016-10-27 SURGERY — LEFT HEART CATH AND CORONARY ANGIOGRAPHY
Anesthesia: LOCAL

## 2016-10-27 MED ORDER — LIDOCAINE HCL (PF) 1 % IJ SOLN
INTRAMUSCULAR | Status: DC | PRN
  Administered 2016-10-27: 2 mL

## 2016-10-27 MED ORDER — SODIUM CHLORIDE 0.9 % IV SOLN: INTRAVENOUS | Status: AC | PRN

## 2016-10-27 MED ORDER — TICAGRELOR 90 MG PO TABS
ORAL_TABLET | ORAL | Status: DC | PRN
  Administered 2016-10-27: 180 mg via ORAL

## 2016-10-27 MED ORDER — HYDRALAZINE HCL 20 MG/ML IJ SOLN: 5.0000 mg | INTRAMUSCULAR | Status: AC | PRN

## 2016-10-27 MED ORDER — HEPARIN (PORCINE) IN NACL 2-0.9 UNIT/ML-% IJ SOLN
INTRAMUSCULAR | Status: AC | PRN
  Administered 2016-10-27: 1000 mL

## 2016-10-27 MED ORDER — SODIUM CHLORIDE 0.9% FLUSH
3.0000 mL | Freq: Two times a day (BID) | INTRAVENOUS | Status: DC
  Administered 2016-10-27: 3 mL via INTRAVENOUS

## 2016-10-27 MED ORDER — SODIUM CHLORIDE 0.9% FLUSH: 3.0000 mL | INTRAVENOUS | Status: DC | PRN

## 2016-10-27 MED ORDER — SODIUM CHLORIDE 0.9 % IV SOLN
INTRAVENOUS | Status: AC
  Administered 2016-10-27: 10:00:00 via INTRAVENOUS

## 2016-10-27 MED ORDER — MIDAZOLAM HCL 2 MG/2ML IJ SOLN
INTRAMUSCULAR | Status: DC | PRN
  Administered 2016-10-27: 2 mg via INTRAVENOUS

## 2016-10-27 MED ORDER — SODIUM CHLORIDE 0.9% FLUSH
3.0000 mL | Freq: Two times a day (BID) | INTRAVENOUS | Status: DC
  Administered 2016-10-27 (×2): 3 mL via INTRAVENOUS

## 2016-10-27 MED ORDER — VERAPAMIL HCL 2.5 MG/ML IV SOLN
INTRAVENOUS | Status: DC | PRN
  Administered 2016-10-27: 08:00:00 via INTRA_ARTERIAL

## 2016-10-27 MED ORDER — ASPIRIN 81 MG PO CHEW
324.0000 mg | CHEWABLE_TABLET | ORAL | Status: AC
  Administered 2016-10-27: 324 mg via ORAL
  Filled 2016-10-27: qty 4

## 2016-10-27 MED ORDER — TICAGRELOR 90 MG PO TABS
ORAL_TABLET | ORAL | Status: AC
  Filled 2016-10-27: qty 1

## 2016-10-27 MED ORDER — NITROGLYCERIN 1 MG/10 ML FOR IR/CATH LAB
INTRA_ARTERIAL | Status: AC
  Filled 2016-10-27: qty 10

## 2016-10-27 MED ORDER — IOPAMIDOL (ISOVUE-370) INJECTION 76%
INTRAVENOUS | Status: AC
  Filled 2016-10-27: qty 100

## 2016-10-27 MED ORDER — ASPIRIN EC 81 MG PO TBEC
81.0000 mg | DELAYED_RELEASE_TABLET | Freq: Every day | ORAL | Status: DC
  Administered 2016-10-28: 81 mg via ORAL
  Filled 2016-10-27: qty 1

## 2016-10-27 MED ORDER — VERAPAMIL HCL 2.5 MG/ML IV SOLN
INTRAVENOUS | Status: AC
  Filled 2016-10-27: qty 2

## 2016-10-27 MED ORDER — ACETAMINOPHEN 325 MG PO TABS: 650.0000 mg | ORAL_TABLET | ORAL | Status: DC | PRN

## 2016-10-27 MED ORDER — IOPAMIDOL (ISOVUE-370) INJECTION 76%
INTRAVENOUS | Status: DC | PRN
  Administered 2016-10-27: 105 mL via INTRA_ARTERIAL

## 2016-10-27 MED ORDER — HEPARIN SODIUM (PORCINE) 1000 UNIT/ML IJ SOLN
INTRAMUSCULAR | Status: DC | PRN
  Administered 2016-10-27 (×2): 5000 [IU] via INTRAVENOUS

## 2016-10-27 MED ORDER — IOPAMIDOL (ISOVUE-370) INJECTION 76%
INTRAVENOUS | Status: AC
  Filled 2016-10-27: qty 50

## 2016-10-27 MED ORDER — LIDOCAINE HCL 1 % IJ SOLN
INTRAMUSCULAR | Status: AC
  Filled 2016-10-27: qty 20

## 2016-10-27 MED ORDER — NITROGLYCERIN 1 MG/10 ML FOR IR/CATH LAB
INTRA_ARTERIAL | Status: DC | PRN
  Administered 2016-10-27 (×2): 200 ug via INTRACORONARY

## 2016-10-27 MED ORDER — BIVALIRUDIN BOLUS VIA INFUSION - CUPID: INTRAVENOUS | Status: DC | PRN

## 2016-10-27 MED ORDER — FENTANYL CITRATE (PF) 100 MCG/2ML IJ SOLN
INTRAMUSCULAR | Status: AC
  Filled 2016-10-27: qty 2

## 2016-10-27 MED ORDER — HEPARIN (PORCINE) IN NACL 2-0.9 UNIT/ML-% IJ SOLN
INTRAMUSCULAR | Status: AC
  Filled 2016-10-27: qty 1000

## 2016-10-27 MED ORDER — TICAGRELOR 90 MG PO TABS
90.0000 mg | ORAL_TABLET | Freq: Two times a day (BID) | ORAL | Status: DC
  Administered 2016-10-27 – 2016-10-28 (×2): 90 mg via ORAL
  Filled 2016-10-27 (×2): qty 1

## 2016-10-27 MED ORDER — HEPARIN SODIUM (PORCINE) 1000 UNIT/ML IJ SOLN
INTRAMUSCULAR | Status: AC
  Filled 2016-10-27: qty 1

## 2016-10-27 MED ORDER — MIDAZOLAM HCL 2 MG/2ML IJ SOLN
INTRAMUSCULAR | Status: AC
  Filled 2016-10-27: qty 2

## 2016-10-27 MED ORDER — ONDANSETRON HCL 4 MG/2ML IJ SOLN: 4.0000 mg | Freq: Four times a day (QID) | INTRAMUSCULAR | Status: DC | PRN

## 2016-10-27 MED ORDER — FENTANYL CITRATE (PF) 100 MCG/2ML IJ SOLN
INTRAMUSCULAR | Status: DC | PRN
  Administered 2016-10-27: 25 ug via INTRAVENOUS

## 2016-10-27 MED ORDER — LABETALOL HCL 5 MG/ML IV SOLN: 10.0000 mg | INTRAVENOUS | Status: AC | PRN

## 2016-10-27 MED ORDER — ASPIRIN 81 MG PO CHEW: 81.0000 mg | CHEWABLE_TABLET | Freq: Every day | ORAL | Status: DC

## 2016-10-27 MED ORDER — SODIUM CHLORIDE 0.9 % IV SOLN: 250.0000 mL | INTRAVENOUS | Status: DC | PRN

## 2016-10-27 MED ORDER — TIROFIBAN HCL IN NACL 5-0.9 MG/100ML-% IV SOLN
INTRAVENOUS | Status: AC
  Filled 2016-10-27: qty 100

## 2016-10-27 SURGICAL SUPPLY — 17 items
BALLN EUPHORA RX 2.0X15 (BALLOONS) ×2
BALLOON EUPHORA RX 2.0X15 (BALLOONS) ×1 IMPLANT
CATH IMPULSE 5F ANG/FL3.5 (CATHETERS) ×2 IMPLANT
CATH LAUNCHER 6FR EBU 3 (CATHETERS) ×2 IMPLANT
DEVICE RAD COMP TR BAND LRG (VASCULAR PRODUCTS) ×2 IMPLANT
GLIDESHEATH SLEND SS 6F .021 (SHEATH) ×2 IMPLANT
GUIDEWIRE INQWIRE 1.5J.035X260 (WIRE) ×1 IMPLANT
INQWIRE 1.5J .035X260CM (WIRE) ×2
KIT ENCORE 26 ADVANTAGE (KITS) ×2 IMPLANT
KIT HEART LEFT (KITS) ×2 IMPLANT
PACK CARDIAC CATHETERIZATION (CUSTOM PROCEDURE TRAY) ×2 IMPLANT
SYR MEDRAD MARK V 150ML (SYRINGE) ×2 IMPLANT
TRANSDUCER W/STOPCOCK (MISCELLANEOUS) ×2 IMPLANT
TUBING CIL FLEX 10 FLL-RA (TUBING) ×2 IMPLANT
VALVE GUARDIAN II ~~LOC~~ HEMO (MISCELLANEOUS) ×2 IMPLANT
WIRE ASAHI PROWATER 180CM (WIRE) ×2 IMPLANT
WIRE HI TORQ BMW 190CM (WIRE) ×2 IMPLANT

## 2016-10-27 NOTE — Progress Notes (Signed)
ANTICOAGULATION CONSULT NOTE - Follow Up Consult  Pharmacy Consult for Heparin Indication: chest pain/ACS  No Known Allergies  Patient Measurements: Height: 6' 2.5" (189.2 cm) Weight: 230 lb 2.6 oz (104.4 kg) IBW/kg (Calculated) : 83.35 Heparin Dosing Weight: 100kg  Vital Signs: Temp: 97.8 F (36.6 C) (05/31 0300) Temp Source: Oral (05/31 0300) BP: 93/62 (05/31 0300) Pulse Rate: 51 (05/31 0300)  Labs:  Recent Labs  10/25/16 2221 10/26/16 0141 10/26/16 0802 10/26/16 1237 10/26/16 1554 10/27/16 0338  HGB 15.5  --   --   --   --  15.1  HCT 45.0  --   --   --   --  44.6  PLT 249  --   --   --   --  216  LABPROT  --   --   --   --   --  13.8  INR  --   --   --   --   --  1.06  HEPARINUNFRC  --   --  0.61  --  0.58 0.72*  CREATININE 0.88  --   --   --   --   --   TROPONINI  --  0.09* 0.13* 0.11*  --   --     Estimated Creatinine Clearance: 123.2 mL/min (by C-G formula based on SCr of 0.88 mg/dL).   Assessment: 55yom started on heparin for NSTEMI. Heparin level up to slightly supratherapeutic (0.72) on gtt at 1300 units/hr. Plan for cath today.  Goal of Therapy:  Heparin level 0.3-0.7 units/ml Monitor platelets by anticoagulation protocol: Yes   Plan:  Decrease heparin to 1200 units/hr Will f/u post cath   Sherlon Handing, PharmD, BCPS Clinical pharmacist, pager [phone] 10/27/2016 4:44 AM

## 2016-10-27 NOTE — Interval H&P Note (Signed)
Cath Lab Visit (complete for each Cath Lab visit)  Clinical Evaluation Leading to the Procedure:   ACS: Yes.    Non-ACS:    Anginal Classification: CCS IV  Anti-ischemic medical therapy: Minimal Therapy (1 class of medications)  Non-Invasive Test Results: No non-invasive testing performed  Prior CABG: No previous CABG      History and Physical Interval Note:  10/27/2016 7:26 AM  Alexander [name]  has presented today for surgery, with the diagnosis of nstemi  The various methods of treatment have been discussed with the patient and family. After consideration of risks, benefits and other options for treatment, the patient has consented to  Procedure(s): Left Heart Cath and Coronary Angiography (N/A) as a surgical intervention .  The patient's history has been reviewed, patient examined, no change in status, stable for surgery.  I have reviewed the patient's chart and labs.  Questions were answered to the patient's satisfaction.     Larae Grooms

## 2016-10-27 NOTE — H&P (View-Only) (Signed)
CARDIOLOGY CONSULT NOTE   Patient ID: Alexander [name] MRN: [id number] DOB/AGE: 11/22/1960 56 y.o.  Admit date: 10/25/2016  Primary Physician   Shirline Frees, MD Primary Cardiologist   New Reason for Consultation   Chest pain Requesting MD: Dr Thereasa Solo  HPI: Alexander [name] is a 56 y.o. male with hx of GERD w/ erosive esophagitis, recurrent strictures requiring dilatation, OA, HTN, sarcoid, colon adenoma, FH premature CAD.   Pt admitted 05/29 with chest pain.  Pt is being seen today for the evaluation of chest pain at the request of Dr Thereasa Solo.  Pt was in usual state of health till 05/28. He woke with back pain that night. He took ASA, which helped. It was 5-6/10, dropped to a 2 and he was able to sleep. He woke with back and chest pain, 5/10. He took ASA 325 mg x 4, which helped.   In the afternoon, he went to [place] to see a client, pain was then 2/10. He had to climb stairs and the back/chest pain got worse, it got to a 6-7/10. He was SOB and a little nauseated. He was concerned. He was belching frequently and that helped his symptoms, down to a 2/10. He got back to work, pain was ok. When he went to a friend's house, he felt worse, drove home and then called 911. At that time, pain was 7/10 and radiated into his neck. He took ASA 325 mg, EMS gave him SL NTG x 2, the second one dropped the pain to a 1-2/10.  The pain has been a heaviness, he still has both, feels the nitro keeps the level down. His pain level has not been a 0/10 since 05/28 pm. When he climbed the stairs, and when he was moving some equipment, he was SOB and nauseated as well.    He has never had pain like this before. On 05/27, he walked in the park for about 2 miles, carrying his granddaughter at times, no sx.    Past Medical History:  Diagnosis Date  . Arthritis   . Chest pain with moderate risk for cardiac etiology 10/26/2016  . Diverticulosis   . Erosive esophagitis    LA class grade B 2007  . Esophageal  stricture   . GERD (gastroesophageal reflux disease)   . Hiatal hernia   . Hypertension   . Sarcoidosis   . 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 COLONOSCOPY    . HERNIA REPAIR    . TONSILLECTOMY AND ADENOIDECTOMY    . UPPER GASTROINTESTINAL ENDOSCOPY      No Known Allergies  I have reviewed the patient's current medications . lisinopril  10 mg Oral Daily  . pantoprazole  80 mg Oral Daily   . heparin 1,300 Units/hr (10/26/16 1100)  . nitroGLYCERIN 20 mcg/min (10/26/16 1310)   acetaminophen, morphine injection, ondansetron (ZOFRAN) IV  Prior to Admission medications   Medication Sig Start Date End Date Taking? Authorizing Provider  aspirin 325 MG tablet Take 325 mg by mouth every 4 (four) hours as needed for pain.   Yes [provider]  esomeprazole (NEXIUM) 40 MG capsule Take 1 capsule (40 mg total) by mouth daily before breakfast. 08/06/12  Yes Ladene Artist, MD  lisinopril (PRINIVIL,ZESTRIL) 10 MG tablet Take 10 mg by mouth daily.  10/22/12  Yes [provider]     Social History   Social History  .  Marital status: Married    Spouse name: N/A  . Number of children: N/A  . Years of education: N/A   Occupational History  . Research scientist (life sciences) [place] History Main Topics  . Smoking status: Never Smoker  . Smokeless tobacco: Never Used  . Alcohol use No  . Drug use: No  . Sexual activity: Not on file   Other Topics Concern  . Not on file   Social History Narrative   Lives with wife in [place], near [place].    Family Status  Relation Status  . Mother Alive  . Father Deceased  . MGM (Not Specified)  . Mat Aunt Deceased  . Neg Hx (Not Specified)   Family History  Problem Relation Age of Onset  . Heart disease Mother        CABG age 50, born 47  . Stomach cancer Father 10  . Leukemia Maternal Grandmother   . Heart attack Maternal Aunt 35  . Colon cancer Neg  Hx   . Diabetes Neg Hx   . Kidney disease Neg Hx   . Liver disease Neg Hx   . Colon polyps Neg Hx   . Rectal cancer Neg Hx      ROS:  Full 14 point review of systems complete and found to be negative unless listed above.  Physical Exam: Blood pressure 114/82, pulse 72, temperature 97.5 F (36.4 C), temperature source Oral, resp. rate 19, height 6' 2.5" (1.892 m), weight 234 lb 9.1 oz (106.4 kg), SpO2 95 %.  General: Well developed, well nourished, male in no acute distress Head: Eyes PERRLA, No xanthomas.   Normocephalic and atraumatic, oropharynx without edema or exudate. Dentition: good Lungs: clear bilaterally Heart: HRRR S1 S2, no rub/gallop, no murmur. pulses are 2+ all 4 extrem.   Neck: No carotid bruits. No lymphadenopathy.  JVD not elevated Abdomen: Bowel sounds present, abdomen soft and non-tender without masses or hernias noted. Msk:  No spine or cva tenderness. No weakness, no joint deformities or effusions. Extremities: No clubbing or cyanosis. No edema.  Neuro: Alert and oriented X 3. No focal deficits noted. Psych:  Good affect, responds appropriately Skin: No rashes or lesions noted.  Labs:   Lab Results  Component Value Date   WBC 7.1 10/25/2016   HGB 15.5 10/25/2016   HCT 45.0 10/25/2016   MCV 88.9 10/25/2016   PLT 249 10/25/2016    Recent Labs Lab 10/25/16 2221  NA 134*  K 3.6  CL 104  CO2 24  BUN 11  CREATININE 0.88  CALCIUM 9.0  PROT 6.4*  BILITOT 0.8  ALKPHOS 93  ALT 25  AST 20  GLUCOSE 116*  ALBUMIN 3.8    Recent Labs  10/26/16 0141 10/26/16 0802 10/26/16 1237  TROPONINI 0.09* 0.13* 0.11*    Recent Labs  10/25/16 2231  TROPIPOC 0.01    Echo: n/a  ECG:  SR, R axis deviation, ?RVH  Cath: n/a  Radiology:  Dg Chest 2 View Result Date: 10/25/2016 CLINICAL DATA:  A 56-year-old male with chest pain. EXAM: CHEST  2 VIEW COMPARISON:  Chest CT dated 06/01/2012 FINDINGS: The lungs are clear. There is no pleural effusion or  pneumothorax. Top-normal cardiac size. No acute osseous pathology. IMPRESSION: No active cardiopulmonary disease. Electronically Signed   By: Anner Crete M.D.   On: 10/25/2016 23:44   Ct Angio Chest/abd/pel For Dissection W And/or Wo Contrast Result Date: 10/26/2016 CLINICAL DATA:  56 year old male with chest and back  pain. Evaluate for aortic dissection. EXAM: CT ANGIOGRAPHY CHEST, ABDOMEN AND PELVIS TECHNIQUE: Multidetector CT imaging through the chest, abdomen and pelvis was performed using the standard protocol during bolus administration of intravenous contrast. Multiplanar reconstructed images and MIPs were obtained and reviewed to evaluate the vascular anatomy. CONTRAST:  100 cc Isovue 370 COMPARISON:  Chest radiograph dated 10/25/2016, chest CT dated 06/01/2012 FINDINGS: CTA CHEST FINDINGS Cardiovascular: There is no cardiomegaly or pericardial effusion. Coronary vascular calcification primarily involving the LAD and right coronary artery. The thoracic aorta appears unremarkable. No aneurysmal dilatation or evidence of dissection. The origins of the great vessels of the aortic arch appear patent. The central pulmonary arteries appear unremarkable. Mediastinum/Nodes: There is no hilar or mediastinal adenopathy. The esophagus is grossly unremarkable. There is lobulated appearance of the fibroid gland. Lungs/Pleura: Lungs are clear. No pleural effusion or pneumothorax. Musculoskeletal: No chest wall abnormality. No acute or significant osseous findings. Review of the MIP images confirms the above findings. CTA ABDOMEN AND PELVIS FINDINGS VASCULAR Aorta: Mild atherosclerotic calcification of the aorta. No aneurysmal dilatation or dissection. Celiac: Patent without evidence of aneurysm, dissection, vasculitis or significant stenosis. SMA: Patent without evidence of aneurysm, dissection, vasculitis or significant stenosis. Renals: Both renal arteries are patent without evidence of aneurysm, dissection,  vasculitis, fibromuscular dysplasia or significant stenosis. IMA: Patent without evidence of aneurysm, dissection, vasculitis or significant stenosis. Inflow: Mild atherosclerotic calcification. No aneurysmal dilatation or dissection. Veins: No obvious venous abnormality within the limitations of this arterial phase study. Review of the MIP images confirms the above findings. NON-VASCULAR Hepatobiliary: Apparent mild fatty infiltration of the liver. No intrahepatic biliary ductal dilatation. The gallbladder is unremarkable. Pancreas: Unremarkable. No pancreatic ductal dilatation or surrounding inflammatory changes. Spleen: Normal in size without focal abnormality. Adrenals/Urinary Tract: The adrenal glands are unremarkable. There is an 8 mm right renal exophytic upper pole hypodense lesion, incompletely characterized, but appears stable compared to the study of 2014 and most likely representing a cyst. The kidneys are otherwise unremarkable. The visualized ureters and urinary bladder appear unremarkable as well. Stomach/Bowel: There is extensive sigmoid and colonic diverticulosis without active inflammatory changes. There is a small hiatal hernia. There is a 2.2 cm distal duodenal diverticulum without active inflammation. There is no evidence of bowel obstruction or active inflammation. Normal appendix. Lymphatic: No adenopathy. Reproductive: The prostate and seminal vesicles are grossly unremarkable. Other: Small fat containing umbilical hernia. Musculoskeletal: No acute or significant osseous findings. Review of the MIP images confirms the above findings. IMPRESSION: 1. No acute intrathoracic, abdominal, or pelvic pathology. No aortic dissection or aneurysm. No CT evidence of pulmonary embolism. 2. Coronary vascular calcification. 3. Colonic diverticulosis. No bowel obstruction or active inflammation. Normal appendix. Electronically Signed   By: Anner Crete M.D.   On: 10/26/2016 01:04    ASSESSMENT AND  PLAN:   The patient was seen today by Dr Angelena Form, the patient evaluated and the data reviewed.  Principal Problem:   Chest pain, rule out acute myocardial infarction - sx have been going on for almost 48 hrs  - However, FH CAD is very significant and sx improved w/ SL NTG, ECG is abnl (though not clearly ischemic) and ez are elevated. - cath may be best option, - The risks and benefits of a cardiac catheterization including, but not limited to, death, stroke, MI, kidney damage and bleeding were discussed with the patient and his wife who indicate understanding and agree to proceed.   Active Problems:   Hypertension - BP controlled  on current rx   Signed: Lenoard Aden 10/26/2016 2:37 PM Beeper [phone]  I have personally seen and examined this patient with Rosaria Ferries, PA-C. I agree with the assessment and plan as outlined above. He has a personal history of HTN and strong FH of CAD. He is now presenting with chest pain worrisome for unstable angina. Troponin is mildly elevated at 0.13. EKG shows no injury current. I have personally reviewed his EKG and it shows sinus rhythm. He has mild chest pain at this time.  My exam shows: General: Well developed, well nourished, NAD  HEENT: OP clear, mucus membranes moist  SKIN: warm, dry. No rashes. Neuro: No focal deficits  Musculoskeletal: Muscle strength 5/5 all ext  Psychiatric: Mood and affect normal  Neck: No JVD, no carotid bruits, no thyromegaly, no lymphadenopathy.  Lungs:Clear bilaterally, no wheezes, rhonci, crackles Cardiovascular: Regular rate and rhythm. No murmurs, gallops or rubs. Abdomen:Soft. Bowel sounds present. Non-tender.  Extremities: No lower extremity edema. Pulses are 2 + in the bilateral DP/PT. Plan:  1. Unstable angina: He has classic story of UA. Troponin mildly elevated. Plan cardiac cath in the am. NPO at midnight. Risks and benefits of cath reviewed with pt. Continue IV NTG, IV heparin. Start ASA,  statin and beta blocker.

## 2016-10-27 NOTE — Progress Notes (Addendum)
Patient ID: Alexander [name], male   DOB: 01-Sep-1960, 56 y.o.   MRN: [id number]                                                                PROGRESS NOTE                                                                                                                                                                                                             Patient Demographics:    Alexander [name], is a 56 y.o. male, DOB - 1960/11/24, UXN:[id number]  Admit date - 10/25/2016   Admitting Physician Etta Quill, DO  Outpatient Primary MD for the patient is Shirline Frees, MD  LOS - 0  Outpatient Specialists:     Chief Complaint  Patient presents with  . Chest Pain       Brief Narrative  56 y.o.malewith history of HTN, sarcoidosis, and GERD w/ erosive esophagitis who presented to the ED with c/o persistent, worsening, central upper back pain and chest pain for ~24hrs described as a "heavy pressure" like sensation in the central chest. The pain radiated to the L jaw and was worse with activity/better at rest. Radiation to L jaw.     Subjective:    Alexander [name] today has no chest pain, no sob.  S/p cath.  Doing well.    No headache,  No abdominal pain - No Nausea, No new weakness tingling or numbness, No Cough - SOB.    Assessment  & Plan :    Principal Problem:   Chest pain with moderate risk for cardiac etiology Active Problems:   Hypertension   Unstable angina (HCC)   Hyponatremia   Non-ST elevation (NSTEMI) myocardial infarction [facility])    Chest Pain Troponin only very mildly elevated - EKG w/o acute findings - Mother + early CAD Cath this am=> + CAD s/p angioplasty 1st diagonal Cont aspirin, metoprolol, lisinopril, lipitor, iv heparin=> aspirin, brillinta, lisinopril, lipitor Appreciate cardiology input  HH / GERD / erosive esophagitis  Cont protonix  HTN  Sarcoidosis    Code Status : FULL CODE  Family Communication  : w patient  Disposition Plan  :  home  Barriers For Discharge :   Consults  :  cardiology  Procedures  :   DVT  Prophylaxis  :  Heparin - SCDs   Lab Results  Component Value Date   PLT 216 10/27/2016    Antibiotics  :   none  Anti-infectives    None        Objective:   Vitals:   10/27/16 0829 10/27/16 0945 10/27/16 1000 10/27/16 1100  BP: 105/73 (!) 87/69 96/76 98/80   Pulse: 63 (!) 54 (!) 55 (!) 53  Resp: 20  13 14   Temp:      TempSrc:      SpO2: 95%  98% 99%  Weight:      Height:        Wt Readings from Last 3 Encounters:  10/27/16 104.4 kg (230 lb 2.6 oz)  06/09/16 108.9 kg (240 lb)  05/26/16 109 kg (240 lb 6.4 oz)     Intake/Output Summary (Last 24 hours) at 10/27/16 1307 Last data filed at 10/27/16 0945  Gross per 24 hour  Intake           564.71 ml  Output                0 ml  Net           564.71 ml     Physical Exam  Awake Alert, Oriented X 3, No new F.N deficits, Normal affect [place].AT,PERRAL Supple Neck,No JVD, No cervical lymphadenopathy appriciated.  Symmetrical Chest wall movement, Good air movement bilaterally, CTAB RRR,No Gallops,Rubs or new Murmurs, No Parasternal Heave +ve B.Sounds, Abd Soft, No tenderness, No organomegaly appriciated, No rebound - guarding or rigidity. No Cyanosis, Clubbing or edema, No new Rash or bruise   Good color to fingers right hand, good pulse    Data Review:    CBC  Recent Labs Lab 10/25/16 2221 10/27/16 0338  WBC 7.1 6.6  HGB 15.5 15.1  HCT 45.0 44.6  PLT 249 216  MCV 88.9 89.0  MCH 30.6 30.1  MCHC 34.4 33.9  RDW 13.8 13.7  LYMPHSABS 2.1  --   MONOABS 0.4  --   EOSABS 0.3  --   BASOSABS 0.1  --     Chemistries   Recent Labs Lab 10/25/16 2221 10/27/16 0338  NA 134* 139  K 3.6 3.8  CL 104 107  CO2 24 20*  GLUCOSE 116* 77  BUN 11 9  CREATININE 0.88 0.84  CALCIUM 9.0 8.9  AST 20  --   ALT 25  --   ALKPHOS 93  --   BILITOT 0.8  --     ------------------------------------------------------------------------------------------------------------------ No results for input(s): CHOL, HDL, LDLCALC, TRIG, CHOLHDL, LDLDIRECT in the last 72 hours.  No results found for: HGBA1C ------------------------------------------------------------------------------------------------------------------ No results for input(s): TSH, T4TOTAL, T3FREE, THYROIDAB in the last 72 hours.  Invalid input(s): FREET3 ------------------------------------------------------------------------------------------------------------------ No results for input(s): VITAMINB12, FOLATE, FERRITIN, TIBC, IRON, RETICCTPCT in the last 72 hours.  Coagulation profile  Recent Labs Lab 10/27/16 0338  INR 1.06    No results for input(s): DDIMER in the last 72 hours.  Cardiac Enzymes  Recent Labs Lab 10/26/16 0141 10/26/16 0802 10/26/16 1237  TROPONINI 0.09* 0.13* 0.11*   ------------------------------------------------------------------------------------------------------------------ No results found for: BNP  Inpatient Medications  Scheduled Meds: . [START ON 10/28/2016] aspirin EC  81 mg Oral Daily  . atorvastatin  80 mg Oral q1800  . lisinopril  10 mg Oral Daily  . metoprolol tartrate  25 mg Oral BID  . pantoprazole  80 mg Oral Daily  . sodium chloride flush  3 mL Intravenous Q12H  .  ticagrelor  90 mg Oral BID   Continuous Infusions: . sodium chloride 100 mL/hr at 10/27/16 0945  . sodium chloride    . nitroGLYCERIN Stopped (10/26/16 2210)   PRN Meds:.sodium chloride, acetaminophen, gi cocktail, hydrALAZINE, labetalol, morphine injection, ondansetron (ZOFRAN) IV, sodium chloride flush  Micro Results Recent Results (from the past 240 hour(s))  MRSA PCR Screening     Status: None   Collection Time: 10/26/16  4:58 AM  Result Value Ref Range Status   MRSA by PCR NEGATIVE NEGATIVE Final    Comment:        The GeneXpert MRSA Assay (FDA approved  for NASAL specimens only), is one component of a comprehensive MRSA colonization surveillance program. It is not intended to diagnose MRSA infection nor to guide or monitor treatment for MRSA infections. Performed at [facility], [place] [address] [place],  [address]     Radiology Reports Dg Chest 2 View  Result Date: 10/25/2016 CLINICAL DATA:  A 17-year-old male with chest pain. EXAM: CHEST  2 VIEW COMPARISON:  Chest CT dated 06/01/2012 FINDINGS: The lungs are clear. There is no pleural effusion or pneumothorax. Top-normal cardiac size. No acute osseous pathology. IMPRESSION: No active cardiopulmonary disease. Electronically Signed   By: Anner Crete M.D.   On: 10/25/2016 23:44   Ct Angio Chest/abd/pel For Dissection W And/or Wo Contrast  Result Date: 10/26/2016 CLINICAL DATA:  56 year old male with chest and back pain. Evaluate for aortic dissection. EXAM: CT ANGIOGRAPHY CHEST, ABDOMEN AND PELVIS TECHNIQUE: Multidetector CT imaging through the chest, abdomen and pelvis was performed using the standard protocol during bolus administration of intravenous contrast. Multiplanar reconstructed images and MIPs were obtained and reviewed to evaluate the vascular anatomy. CONTRAST:  100 cc Isovue 370 COMPARISON:  Chest radiograph dated 10/25/2016, chest CT dated 06/01/2012 FINDINGS: CTA CHEST FINDINGS Cardiovascular: There is no cardiomegaly or pericardial effusion. Coronary vascular calcification primarily involving the LAD and right coronary artery. The thoracic aorta appears unremarkable. No aneurysmal dilatation or evidence of dissection. The origins of the great vessels of the aortic arch appear patent. The central pulmonary arteries appear unremarkable. Mediastinum/Nodes: There is no hilar or mediastinal adenopathy. The esophagus is grossly unremarkable. There is lobulated appearance of the fibroid gland. Lungs/Pleura: Lungs are clear. No pleural effusion or  pneumothorax. Musculoskeletal: No chest wall abnormality. No acute or significant osseous findings. Review of the MIP images confirms the above findings. CTA ABDOMEN AND PELVIS FINDINGS VASCULAR Aorta: Mild atherosclerotic calcification of the aorta. No aneurysmal dilatation or dissection. Celiac: Patent without evidence of aneurysm, dissection, vasculitis or significant stenosis. SMA: Patent without evidence of aneurysm, dissection, vasculitis or significant stenosis. Renals: Both renal arteries are patent without evidence of aneurysm, dissection, vasculitis, fibromuscular dysplasia or significant stenosis. IMA: Patent without evidence of aneurysm, dissection, vasculitis or significant stenosis. Inflow: Mild atherosclerotic calcification. No aneurysmal dilatation or dissection. Veins: No obvious venous abnormality within the limitations of this arterial phase study. Review of the MIP images confirms the above findings. NON-VASCULAR Hepatobiliary: Apparent mild fatty infiltration of the liver. No intrahepatic biliary ductal dilatation. The gallbladder is unremarkable. Pancreas: Unremarkable. No pancreatic ductal dilatation or surrounding inflammatory changes. Spleen: Normal in size without focal abnormality. Adrenals/Urinary Tract: The adrenal glands are unremarkable. There is an 8 mm right renal exophytic upper pole hypodense lesion, incompletely characterized, but appears stable compared to the study of 2014 and most likely representing a cyst. The kidneys are otherwise unremarkable. The visualized ureters and urinary bladder appear unremarkable as  well. Stomach/Bowel: There is extensive sigmoid and colonic diverticulosis without active inflammatory changes. There is a small hiatal hernia. There is a 2.2 cm distal duodenal diverticulum without active inflammation. There is no evidence of bowel obstruction or active inflammation. Normal appendix. Lymphatic: No adenopathy. Reproductive: The prostate and seminal  vesicles are grossly unremarkable. Other: Small fat containing umbilical hernia. Musculoskeletal: No acute or significant osseous findings. Review of the MIP images confirms the above findings. IMPRESSION: 1. No acute intrathoracic, abdominal, or pelvic pathology. No aortic dissection or aneurysm. No CT evidence of pulmonary embolism. 2. Coronary vascular calcification. 3. Colonic diverticulosis. No bowel obstruction or active inflammation. Normal appendix. Electronically Signed   By: Anner Crete M.D.   On: 10/26/2016 01:04    Time Spent in minutes  30   Jani Gravel M.D on 10/27/2016 at 1:07 PM  Between 7am to 7pm - Pager - [phone] After 7pm go to [email or web address] - password [facility]  [facility] -  Office  [phone]

## 2016-10-27 NOTE — Progress Notes (Deleted)
Hypoglycemic Event  CBG: 62  Treatment: 25 mL D50 at 0445  Symptoms: none  Follow-up CBG: Time:0507 CBG Result:121  Possible Reasons for Event: Inadequate Po intake  Comments/MD notified: yes.    Alexander [name] N

## 2016-10-27 NOTE — Progress Notes (Signed)
Patient had a non-sustained 5 beats run of VTach at [place]. Patient is asymptomatic with no c/o CP or SOB. Cardiology notified.

## 2016-10-28 ENCOUNTER — Encounter (HOSPITAL_COMMUNITY): Payer: Self-pay | Admitting: Interventional Cardiology

## 2016-10-28 DIAGNOSIS — I259 Chronic ischemic heart disease, unspecified: Secondary | ICD-10-CM

## 2016-10-28 DIAGNOSIS — E784 Other hyperlipidemia: Secondary | ICD-10-CM

## 2016-10-28 DIAGNOSIS — I214 Non-ST elevation (NSTEMI) myocardial infarction: Principal | ICD-10-CM

## 2016-10-28 DIAGNOSIS — Z9861 Coronary angioplasty status: Secondary | ICD-10-CM

## 2016-10-28 DIAGNOSIS — I10 Essential (primary) hypertension: Secondary | ICD-10-CM

## 2016-10-28 DIAGNOSIS — E783 Hyperchylomicronemia: Secondary | ICD-10-CM

## 2016-10-28 DIAGNOSIS — I25110 Atherosclerotic heart disease of native coronary artery with unstable angina pectoris: Secondary | ICD-10-CM

## 2016-10-28 LAB — BASIC METABOLIC PANEL
Anion gap: 9 (ref 5–15)
BUN: 10 mg/dL (ref 6–20)
CO2: 23 mmol/L (ref 22–32)
Calcium: 8.8 mg/dL — ABNORMAL LOW (ref 8.9–10.3)
Chloride: 106 mmol/L (ref 101–111)
Creatinine, Ser: 0.84 mg/dL (ref 0.61–1.24)
GFR calc Af Amer: >60 mL/min (ref >60)
GFR calc non Af Amer: >60 mL/min (ref >60)
Glucose, Bld: 80 mg/dL (ref 65–99)
Potassium: 3.6 mmol/L (ref 3.5–5.1)
Sodium: 138 mmol/L (ref 135–145)

## 2016-10-28 LAB — CBC
HCT: 43.3 % (ref 39.0–52.0)
Hemoglobin: 14.9 g/dL (ref 13.0–17.0)
MCH: 31.1 pg (ref 26.0–34.0)
MCHC: 34.4 g/dL (ref 30.0–36.0)
MCV: 90.4 fL (ref 78.0–100.0)
Platelets: 210 10*3/uL (ref 150–400)
RBC: 4.79 MIL/uL (ref 4.22–5.81)
RDW: 13.9 % (ref 11.5–15.5)
WBC: 5.5 10*3/uL (ref 4.0–10.5)

## 2016-10-28 MED ORDER — TIROFIBAN (AGGRASTAT) BOLUS VIA INFUSION
INTRAVENOUS | Status: DC | PRN
  Administered 2016-10-27: 2610 ug via INTRAVENOUS

## 2016-10-28 MED ORDER — PANTOPRAZOLE SODIUM 40 MG PO TBEC: 80.0000 mg | DELAYED_RELEASE_TABLET | Freq: Every day | ORAL | 0 refills | Status: DC

## 2016-10-28 MED ORDER — NITROGLYCERIN 0.4 MG SL SUBL: 0.4000 mg | SUBLINGUAL_TABLET | SUBLINGUAL | 0 refills | Status: DC | PRN

## 2016-10-28 MED ORDER — TICAGRELOR 90 MG PO TABS: 90.0000 mg | ORAL_TABLET | Freq: Two times a day (BID) | ORAL | 0 refills | Status: DC

## 2016-10-28 MED ORDER — ASPIRIN 81 MG PO TBEC: 81.0000 mg | DELAYED_RELEASE_TABLET | Freq: Every day | ORAL | 0 refills | Status: AC

## 2016-10-28 MED ORDER — TIROFIBAN HCL IN NACL 5-0.9 MG/100ML-% IV SOLN
INTRAVENOUS | Status: DC | PRN
  Administered 2016-10-27: 0.15 ug/kg/min via INTRAVENOUS

## 2016-10-28 MED ORDER — ATORVASTATIN CALCIUM 80 MG PO TABS: 80.0000 mg | ORAL_TABLET | Freq: Every day | ORAL | 0 refills | Status: DC

## 2016-10-28 MED ORDER — METOPROLOL TARTRATE 25 MG PO TABS: 25.0000 mg | ORAL_TABLET | Freq: Two times a day (BID) | ORAL | 0 refills | Status: DC

## 2016-10-28 NOTE — Progress Notes (Signed)
Pt d/c'd via wheelchair.  Right AC IV d/c'd.  Pt verbalizes understanding of new medications and importance of followup appt.  Prescriptions given to pt.

## 2016-10-28 NOTE — Progress Notes (Signed)
Pt had just walked approximately 250 ft with his wife and RN on my arrival. Looks and feels good. Occasional PVC noted with SB. Ed completed with pt and wife with good reception. Will refer to [place]. Pt needs Brilinta coupon.  [phone] [place] 10:38 AM 10/28/2016

## 2016-10-28 NOTE — Care Management Note (Addendum)
Case Management Note  Patient Details  Name: DODGE ATOR MRN: [id number] Date of Birth: July 19, 1960  Subjective/Objective:    PT admitted STEMI - s/p Cath                Action/Plan:   PTA independent from home with wife.  Pt given both free 30 day and reduced copay card for Brilinta - CM submitted benefit check and prior Josem Kaufmann is not required.  Pt preferred pharmacy is [facility] [address] in [place] confirmed that pharmacy can fill prescription.   Expected Discharge Date:                  Expected Discharge Plan:  Home/Self Care  In-House Referral:     Discharge planning Services  CM Consult, Medication Assistance  Post Acute Care Choice:    Choice offered to:     DME Arranged:    DME Agency:     HH Arranged:    HH Agency:     Status of Service:  In process, will continue to follow  If discussed at Long Length of Stay Meetings, dates discussed:    Additional Comments:  Maryclare Labrador, RN 10/28/2016, 11:13 AM

## 2016-10-28 NOTE — Progress Notes (Signed)
Progress Note  Patient Name: Alexander [name] Date of Encounter: 10/28/2016  Primary Cardiologist: Angelena Form  Subjective   No chest pain or dyspnea  Inpatient Medications    Scheduled Meds: . aspirin EC  81 mg Oral Daily  . atorvastatin  80 mg Oral q1800  . lisinopril  10 mg Oral Daily  . metoprolol tartrate  25 mg Oral BID  . pantoprazole  80 mg Oral Daily  . sodium chloride flush  3 mL Intravenous Q12H  . ticagrelor  90 mg Oral BID   Continuous Infusions: . sodium chloride    . nitroGLYCERIN Stopped (10/26/16 2210)   PRN Meds: sodium chloride, acetaminophen, gi cocktail, morphine injection, ondansetron (ZOFRAN) IV, sodium chloride flush   Vital Signs    Vitals:   10/27/16 2154 10/28/16 0242 10/28/16 0310 10/28/16 0829  BP: 107/74 96/72  103/76  Pulse:  (!) 56  (!) 51  Resp: 19 13 17 15   Temp: 97.9 F (36.6 C) 97.6 F (36.4 C)  98.1 F (36.7 C)  TempSrc: Oral Oral  Oral  SpO2: 96% 97% 98% 97%  Weight:      Height:        Intake/Output Summary (Last 24 hours) at 10/28/16 0901 Last data filed at 10/28/16 0243  Gross per 24 hour  Intake           876.97 ml  Output              600 ml  Net           276.97 ml   Filed Weights   10/25/16 2223 10/26/16 0506 10/27/16 0300  Weight: 242 lb (109.8 kg) 234 lb 9.1 oz (106.4 kg) 230 lb 2.6 oz (104.4 kg)    Telemetry    sinus - Personally Reviewed  ECG      Physical Exam    General: Well developed, well nourished, NAD  HEENT: OP clear, mucus membranes moist  SKIN: warm, dry. No rashes. Neuro: No focal deficits  Musculoskeletal: Muscle strength 5/5 all ext  Psychiatric: Mood and affect normal  Neck: No JVD, no carotid bruits, no thyromegaly, no lymphadenopathy.  Lungs:Clear bilaterally, no wheezes, rhonci, crackles Cardiovascular: Regular rate and rhythm. No murmurs, gallops or rubs. Abdomen:Soft. Bowel sounds present. Non-tender.  Extremities: No lower extremity edema. Pulses are 2 + in the bilateral  DP/PT.      Labs    Chemistry Recent Labs Lab 10/25/16 2221 10/27/16 0338 10/28/16 0305  NA 134* 139 138  K 3.6 3.8 3.6  CL 104 107 106  CO2 24 20* 23  GLUCOSE 116* 77 80  BUN 11 9 10   CREATININE 0.88 0.84 0.84  CALCIUM 9.0 8.9 8.8*  PROT 6.4*  --   --   ALBUMIN 3.8  --   --   AST 20  --   --   ALT 25  --   --   ALKPHOS 93  --   --   BILITOT 0.8  --   --   GFRNONAA >60 >60 >60  GFRAA >60 >60 >60  ANIONGAP 6 12 9      Hematology Recent Labs Lab 10/25/16 2221 10/27/16 0338 10/28/16 0305  WBC 7.1 6.6 5.5  RBC 5.06 5.01 4.79  HGB 15.5 15.1 14.9  HCT 45.0 44.6 43.3  MCV 88.9 89.0 90.4  MCH 30.6 30.1 31.1  MCHC 34.4 33.9 34.4  RDW 13.8 13.7 13.9  PLT 249 216 210    Cardiac Enzymes Recent Labs  Lab 10/26/16 0141 10/26/16 0802 10/26/16 1237  TROPONINI 0.09* 0.13* 0.11*    Recent Labs Lab 10/25/16 2231  TROPIPOC 0.01       Radiology    No results found.  Cardiac Studies   Cardiac cath 10/27/16: Left Ventricle The left ventricular size is normal. The left ventricular systolic function is normal. LV end diastolic pressure is normal. The left ventricular ejection fraction is 55-65% by visual estimate. There are LV function abnormalities due to segmental dysfunction.    Aortic Valve There is no aortic valve stenosis.    Coronary Diagrams   Diagnostic Diagram       Post-Intervention Diagram         Patient Profile     56 y.o. male with history of GERD, HTN admitted with chest pain/NSTEMI  Assessment & Plan    1. CAD with U/A, NSTEMI: Pt admitted with NSTEMI. Cardiac cath 10/27/16 with severe stenosis ostial Diagonal. This was treated with balloon angioplasty only. No stent placed. Will continue ASA and Brilinta for one month then plan ASA with Plavix. Continue statin, beta blocker. Will d/c Ace-inh with relative hypotension. Discharge home today. Follow up with me or office app in 1-2 weeks  Signed, Lauree Chandler, MD  10/28/2016,  9:01 AM

## 2016-10-28 NOTE — Discharge Summary (Signed)
Physician Discharge Summary  Alexander [name] RCV:[id number] DOB: 09-28-1960 DOA: 10/25/2016  PCP: Shirline Frees, MD  Admit date: 10/25/2016 Discharge date: 10/28/2016  Time spent: 30 minutes  Recommendations for Outpatient Follow-up:  CAD with U/A, NSTEMI:  -Pt admitted with NSTEMI. Cardiac cath 10/27/16 with severe stenosis ostial Diagonal. This was treated with balloon angioplasty only. No stent placed. Will continue ASA and Brilinta for one month then plan ASA with Plavix. Continue statin, beta blocker. Will d/c Ace-inh with relative hypotension. Discharge home today. Follow up with Dr. Angelena Form, [facility] Gave D, in 1-2 weeks   HTN -Metoprolol 25 mg BID -Schedule Follow-up with Dr. Shirline Frees in one week HTN, NSTEMI, HLD  HLD -Lipitor daily      Discharge Diagnoses:  Principal Problem:   Chest pain with moderate risk for cardiac etiology Active Problems:   Hypertension   Unstable angina (HCC)   Hyponatremia   Non-ST elevation (NSTEMI) myocardial infarction [facility])   Chest pain   Discharge Condition: Stable  Diet recommendation: Cardiac diet  Filed Weights   10/25/16 2223 10/26/16 0506 10/27/16 0300  Weight: 242 lb (109.8 kg) 234 lb 9.1 oz (106.4 kg) 230 lb 2.6 oz (104.4 kg)    History of present illness:  56 y.o.malePMHx HTN, sarcoidosis, and GERD w/ erosive esophagitis who presentedto the ED with c/o persistent, worsening, central upper back pain and chest pain for ~24hrs described as a "heavy pressure" like sensation in the central chest. The pain radiated to the L jaw and was worse with activity/better at rest. Radiation to L jaw. Patient diagnosed with NSTEMI . Patient seen by cardiology and underwent cardiac catheterization, 10/27/16 found to have severe stenosis ostial Diagonal. This was treated with balloon angioplasty only. No stent placed. Per cardiology note 6/1 patient stable for discharge     Procedures: 5/31 cardiac catheterization: severe stenosis  ostial Diagonal. This was treated with balloon angioplasty only. No stent placed. -LVEF = 55-65%    Consultations: Cardiology Dr.McAlhany, Annita Brod    Discharge Exam: Vitals:   10/28/16 0900 10/28/16 1000 10/28/16 1100 10/28/16 1200  BP: 117/84     Pulse: 68     Resp: 18 15 18 13   Temp:      TempSrc:      SpO2: 97%     Weight:      Height:        General: A/O 4, negative acute respiratory failure Cardiovascular: Regular rhythm and rate, negative murmurs rubs gallops, normal S1/S2 Respiratory: Clear to auscultation bilateral negative wheezing or crackles  Discharge Instructions  Discharge Instructions    Amb Referral to Cardiac Rehabilitation    Complete by:  As directed    Diagnosis:   PTCA NSTEMI       Allergies as of 10/28/2016   No Known Allergies     Medication List    STOP taking these medications   aspirin 325 MG tablet Replaced by:  aspirin 81 MG EC tablet   esomeprazole 40 MG capsule Commonly known as:  Pender by:  pantoprazole 40 MG tablet   lisinopril 10 MG tablet Commonly known as:  PRINIVIL,ZESTRIL     TAKE these medications   aspirin 81 MG EC tablet Take 1 tablet (81 mg total) by mouth daily. Start taking on:  10/29/2016 Replaces:  aspirin 325 MG tablet   atorvastatin 80 MG tablet Commonly known as:  LIPITOR Take 1 tablet (80 mg total) by mouth daily at 6 PM.   metoprolol tartrate 25 MG tablet  Commonly known as:  LOPRESSOR Take 1 tablet (25 mg total) by mouth 2 (two) times daily.   nitroGLYCERIN 0.4 MG SL tablet Commonly known as:  NITROSTAT Place 1 tablet (0.4 mg total) under the tongue every 5 (five) minutes as needed for chest pain.   pantoprazole 40 MG tablet Commonly known as:  PROTONIX Take 2 tablets (80 mg total) by mouth daily. Start taking on:  10/29/2016 Replaces:  esomeprazole 40 MG capsule   ticagrelor 90 MG Tabs tablet Commonly known as:  BRILINTA Take 1 tablet (90 mg total) by mouth 2 (two) times  daily.      No Known Allergies Follow-up Information    Charlie Pitter, PA-C Follow up on 11/09/2016.   Specialties:  Cardiology, Radiology Why:  at 11am for your follow up appt.  Contact information: [address] [place] [address] [phone]        Shirline Frees, MD. Schedule an appointment as soon as possible for a visit in 1 week(s).   Specialty:  Family Medicine Why:  Follow-up with Dr. Shirline Frees in one week HTN, NSTEMI, HLD Contact information: [place] [address] [phone]            The results of significant diagnostics from this hospitalization (including imaging, microbiology, ancillary and laboratory) are listed below for reference.    Significant Diagnostic Studies: Dg Chest 2 View  Result Date: 10/25/2016 CLINICAL DATA:  A 74-year-old male with chest pain. EXAM: CHEST  2 VIEW COMPARISON:  Chest CT dated 06/01/2012 FINDINGS: The lungs are clear. There is no pleural effusion or pneumothorax. Top-normal cardiac size. No acute osseous pathology. IMPRESSION: No active cardiopulmonary disease. Electronically Signed   By: Anner Crete M.D.   On: 10/25/2016 23:44   Ct Angio Chest/abd/pel For Dissection W And/or Wo Contrast  Result Date: 10/26/2016 CLINICAL DATA:  56 year old male with chest and back pain. Evaluate for aortic dissection. EXAM: CT ANGIOGRAPHY CHEST, ABDOMEN AND PELVIS TECHNIQUE: Multidetector CT imaging through the chest, abdomen and pelvis was performed using the standard protocol during bolus administration of intravenous contrast. Multiplanar reconstructed images and MIPs were obtained and reviewed to evaluate the vascular anatomy. CONTRAST:  100 cc Isovue 370 COMPARISON:  Chest radiograph dated 10/25/2016, chest CT dated 06/01/2012 FINDINGS: CTA CHEST FINDINGS Cardiovascular: There is no cardiomegaly or pericardial effusion. Coronary vascular calcification primarily involving the LAD  and right coronary artery. The thoracic aorta appears unremarkable. No aneurysmal dilatation or evidence of dissection. The origins of the great vessels of the aortic arch appear patent. The central pulmonary arteries appear unremarkable. Mediastinum/Nodes: There is no hilar or mediastinal adenopathy. The esophagus is grossly unremarkable. There is lobulated appearance of the fibroid gland. Lungs/Pleura: Lungs are clear. No pleural effusion or pneumothorax. Musculoskeletal: No chest wall abnormality. No acute or significant osseous findings. Review of the MIP images confirms the above findings. CTA ABDOMEN AND PELVIS FINDINGS VASCULAR Aorta: Mild atherosclerotic calcification of the aorta. No aneurysmal dilatation or dissection. Celiac: Patent without evidence of aneurysm, dissection, vasculitis or significant stenosis. SMA: Patent without evidence of aneurysm, dissection, vasculitis or significant stenosis. Renals: Both renal arteries are patent without evidence of aneurysm, dissection, vasculitis, fibromuscular dysplasia or significant stenosis. IMA: Patent without evidence of aneurysm, dissection, vasculitis or significant stenosis. Inflow: Mild atherosclerotic calcification. No aneurysmal dilatation or dissection. Veins: No obvious venous abnormality within the limitations of this arterial phase study. Review of the MIP images confirms the above findings. NON-VASCULAR  Hepatobiliary: Apparent mild fatty infiltration of the liver. No intrahepatic biliary ductal dilatation. The gallbladder is unremarkable. Pancreas: Unremarkable. No pancreatic ductal dilatation or surrounding inflammatory changes. Spleen: Normal in size without focal abnormality. Adrenals/Urinary Tract: The adrenal glands are unremarkable. There is an 8 mm right renal exophytic upper pole hypodense lesion, incompletely characterized, but appears stable compared to the study of 2014 and most likely representing a cyst. The kidneys are otherwise  unremarkable. The visualized ureters and urinary bladder appear unremarkable as well. Stomach/Bowel: There is extensive sigmoid and colonic diverticulosis without active inflammatory changes. There is a small hiatal hernia. There is a 2.2 cm distal duodenal diverticulum without active inflammation. There is no evidence of bowel obstruction or active inflammation. Normal appendix. Lymphatic: No adenopathy. Reproductive: The prostate and seminal vesicles are grossly unremarkable. Other: Small fat containing umbilical hernia. Musculoskeletal: No acute or significant osseous findings. Review of the MIP images confirms the above findings. IMPRESSION: 1. No acute intrathoracic, abdominal, or pelvic pathology. No aortic dissection or aneurysm. No CT evidence of pulmonary embolism. 2. Coronary vascular calcification. 3. Colonic diverticulosis. No bowel obstruction or active inflammation. Normal appendix. Electronically Signed   By: Anner Crete M.D.   On: 10/26/2016 01:04    Microbiology: Recent Results (from the past 240 hour(s))  MRSA PCR Screening     Status: None   Collection Time: 10/26/16  4:58 AM  Result Value Ref Range Status   MRSA by PCR NEGATIVE NEGATIVE Final    Comment:        The GeneXpert MRSA Assay (FDA approved for NASAL specimens only), is one component of a comprehensive MRSA colonization surveillance program. It is not intended to diagnose MRSA infection nor to guide or monitor treatment for MRSA infections. Performed at [facility], [place] [address] [place], [place] [address]      Labs: Basic Metabolic Panel:  Recent Labs Lab 10/25/16 2221 10/27/16 0338 10/28/16 0305  NA 134* 139 138  K 3.6 3.8 3.6  CL 104 107 106  CO2 24 20* 23  GLUCOSE 116* 77 80  BUN 11 9 10   CREATININE 0.88 0.84 0.84  CALCIUM 9.0 8.9 8.8*   Liver Function Tests:  Recent Labs Lab 10/25/16 2221  AST 20  ALT 25  ALKPHOS 93  BILITOT 0.8  PROT 6.4*  ALBUMIN 3.8    No results for input(s): LIPASE, AMYLASE in the last 168 hours. No results for input(s): AMMONIA in the last 168 hours. CBC:  Recent Labs Lab 10/25/16 2221 10/27/16 0338 10/28/16 0305  WBC 7.1 6.6 5.5  NEUTROABS 4.3  --   --   HGB 15.5 15.1 14.9  HCT 45.0 44.6 43.3  MCV 88.9 89.0 90.4  PLT 249 216 210   Cardiac Enzymes:  Recent Labs Lab 10/26/16 0141 10/26/16 0802 10/26/16 1237  TROPONINI 0.09* 0.13* 0.11*   BNP: BNP (last 3 results) No results for input(s): BNP in the last 8760 hours.  ProBNP (last 3 results) No results for input(s): PROBNP in the last 8760 hours.  CBG: No results for input(s): GLUCAP in the last 168 hours.     Signed:  Dia Crawford, MD [facility] [phone] pager

## 2016-10-31 ENCOUNTER — Telehealth (HOSPITAL_COMMUNITY): Payer: Self-pay

## 2016-10-31 NOTE — Telephone Encounter (Signed)
Patient insurance is active and benefits verified. Patient has [place] - no co-payment, deductible $3000/$3000 has been met, out of pocket $6000/$3646.73 has been met, 10% co-insurance, no pre-authorization and no limit on visit. Passport/reference [phone].  Patient will be contacted and scheduled after follow up appointment with cardiologist on 11/09/16, upon review by the RN navigator.

## 2016-11-08 NOTE — Progress Notes (Signed)
Cardiology Office Note    Date:  11/09/2016  ID:  Alexander [name], DOB 1960-07-31, MRN [id number] PCP:  Shirline Frees, MD  Cardiologist:  Dr. Angelena Form   Chief Complaint: f/u MI  History of Present Illness:  Alexander [name] is a 56 y.o. male with history of GERD, HTN, recently diagnosed NSTEMI/CAD (s/p angioplasty without stenting to ostial diagonal) who presents for post-hospital follow-up. He was recently admitted with CP and minimal troponin elevation felt c/w NSTEMI. LHC 10/27/16 showed 25% ostial LAD, 50% prox-mid LAD, 90% distal Cx (very small vessel, not amenable to PCI), mild RCA, 25% mid-distal LAD, 99% ostial D1 -> the diagonal was treated with PTCA only, no stenting. It was recommended to continue Brilinta for one month and then change to clopidogrel for 1 year given that he did have a small MI. Labs during admission showed K 3.6, Cr 0.84, normal CBC, LFTs ok, no lipids on file.  He returns for follow-up feeling well. He has not had any recurrent chest/back pain. He has declined cardiac rehab, citing that he wishes to continue exercising on his own at home. He and his wife used to walk 2 miles per day in [place] neighborhood and he hopes to get back to that. He started last week. He did notice some exertional DOE but it has been gradually improving with increased endurance each session. He's noticed some mild anxiety at night. No SI/HI or high risk features. He calms down quickly by focusing on his breathing. No orthopnea, LEE, palpitations, syncope. No bleeding.   Past Medical History:  Diagnosis Date  . Arthritis   . Chest pain with moderate risk for cardiac etiology 10/26/2016  . Diverticulosis   . Erosive esophagitis    LA class grade B 2007  . Esophageal stricture   . GERD (gastroesophageal reflux disease)   . Hiatal hernia   . Hypertension   . Sarcoidosis   . 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 COLONOSCOPY    .  CORONARY BALLOON ANGIOPLASTY N/A 10/27/2016   Procedure: Coronary Balloon Angioplasty;  Surgeon: Jettie Booze, MD;  Location: [facility];  Service: Cardiovascular;  Laterality: N/A;  . HERNIA REPAIR    . LEFT HEART CATH AND CORONARY ANGIOGRAPHY N/A 10/27/2016   Procedure: Left Heart Cath and Coronary Angiography;  Surgeon: Jettie Booze, MD;  Location: [facility];  Service: Cardiovascular;  Laterality: N/A;  . TONSILLECTOMY AND ADENOIDECTOMY    . UPPER GASTROINTESTINAL ENDOSCOPY      Current Medications: Current Outpatient Prescriptions  Medication Sig Dispense Refill  . aspirin EC 81 MG EC tablet Take 1 tablet (81 mg total) by mouth daily. 1 tablet 0  . atorvastatin (LIPITOR) 80 MG tablet Take 1 tablet (80 mg total) by mouth daily at 6 PM. 30 tablet 0  . esomeprazole (NEXIUM) 20 MG capsule Take 40 mg by mouth daily at 12 noon.    . metoprolol tartrate (LOPRESSOR) 25 MG tablet Take 1 tablet (25 mg total) by mouth 2 (two) times daily. 60 tablet 0  . nitroGLYCERIN (NITROSTAT) 0.4 MG SL tablet Place 1 tablet (0.4 mg total) under the tongue every 5 (five) minutes as needed for chest pain. 100 tablet 0  . ticagrelor (BRILINTA) 90 MG TABS tablet Take 1 tablet (90 mg total) by mouth 2 (two) times daily. 60 tablet 0   No current facility-administered medications for this visit.  Allergies:   Patient has no known allergies.   Social History   Social History  . Marital status: Married    Spouse name: N/A  . Number of children: N/A  . Years of education: N/A   Occupational History  . Research scientist (life sciences) [employer] History Main Topics  . Smoking status: Never Smoker  . Smokeless tobacco: Never Used  . Alcohol use No  . Drug use: No  . Sexual activity: Not Asked   Other Topics Concern  . None   Social History Narrative   Lives with wife in [place], near [place].     Family History:  Family History  Problem Relation Age of Onset  .  Heart disease Mother        CABG age 88, born 64  . Stomach cancer Father 60  . Leukemia Maternal Grandmother   . Heart attack Maternal Aunt 35  . Colon cancer Neg Hx   . Diabetes Neg Hx   . Kidney disease Neg Hx   . Liver disease Neg Hx   . Colon polyps Neg Hx   . Rectal cancer Neg Hx     ROS:   Please see the history of present illness. All other systems are reviewed and otherwise negative.    PHYSICAL EXAM:   VS:  BP 120/84   Pulse 72   Ht 6' 2.5" (1.892 m)   Wt 233 lb 12.8 oz (106.1 kg)   BMI 29.62 kg/m   BMI: Body mass index is 29.62 kg/m. GEN: Well nourished, well developed WM, in no acute distress  HEENT: normocephalic, atraumatic Neck: no JVD, carotid bruits, or masses Cardiac: RRR; no murmurs, rubs, or gallops, no edema  Respiratory:  clear to auscultation bilaterally, normal work of breathing GI: soft, nontender, nondistended, + BS MS: no deformity or atrophy  Skin: warm and dry, no rash. Right radial cath site without hematoma or ecchymosis; good pulse. Neuro:  Alert and Oriented x 3, Strength and sensation are intact, follows commands Psych: euthymic mood, full affect  Wt Readings from Last 3 Encounters:  11/09/16 233 lb 12.8 oz (106.1 kg)  10/27/16 230 lb 2.6 oz (104.4 kg)  06/09/16 240 lb (108.9 kg)      Studies/Labs Reviewed:   EKG:  EKG was not ordered today.  Recent Labs: 10/25/2016: ALT 25 10/28/2016: BUN 10; Creatinine, Ser 0.84; Hemoglobin 14.9; Platelets 210; Potassium 3.6; Sodium 138   Lipid Panel No results found for: CHOL, TRIG, HDL, CHOLHDL, VLDL, LDLCALC, LDLDIRECT  Additional studies/ records that were reviewed today include: Summarized above.    ASSESSMENT & PLAN:   1. CAD - doing well s/p angioplasty. Continue medical therapy for residual disease. Plan was to continue ASA/Brilinta for 1 month then transition to ASA/Plavix for at least 1 year given NSTEMI. Last day of Brilinta will be the PM of 11/26/16. First day of Plavix 75mg   daily will be 11/27/16. Reviewed with Dr. Tamala Julian - interventionalist, no Plavix load needed given no stenting. Patient declines cardiac rehab at this time. Encouraged gradual continual increase of physical activity. He states he lost his SL NTG bottle and pharmacy would not refill because it was too early. He hasn't had to use any, but would like some on hand. Will send in repeat rx to see if they will fill. If not he may have to pay for this out of pocket. Reviewed instructions for use, as well as warning sx/ER precautions. 2. Essential hypertension - controlled. Continue present  regimen. 3. Unknown lipid status - patient has eaten today. Will plan to check lipids/LFTs in 4-6 weeks. 4. GERD - he restarted Nexium on his own. With plan to change to Plavix, Protonix would be a better option. He had been prescribed this in the hospital but told the pharmacy to disregard rx because he already had Nexium. Will re-prescribe equivalent dose of Protonix.  Disposition: F/u with Dr. Angelena Form in 3-4 months.   Medication Adjustments/Labs and Tests Ordered: Current medicines are reviewed at length with the patient today.  Concerns regarding medicines are outlined above. Medication changes, Labs and Tests ordered today are summarized above and listed in the Patient Instructions accessible in Encounters. TOC visit not charged as he did not have a phone call.  Signed, Charlie Pitter, PA-C  11/09/2016 11:15 AM    [facility] [address], [place], [place]  [address] Phone: [phone]; Fax: [phone]

## 2016-11-09 ENCOUNTER — Ambulatory Visit (INDEPENDENT_AMBULATORY_CARE_PROVIDER_SITE_OTHER): Payer: 59 | Admitting: Physician Assistant

## 2016-11-09 ENCOUNTER — Encounter: Payer: Self-pay | Admitting: Physician Assistant

## 2016-11-09 VITALS — BP 120/84 | HR 72 | Ht 74.5 in | Wt 233.8 lb

## 2016-11-09 DIAGNOSIS — K219 Gastro-esophageal reflux disease without esophagitis: Secondary | ICD-10-CM

## 2016-11-09 DIAGNOSIS — I2510 Atherosclerotic heart disease of native coronary artery without angina pectoris: Secondary | ICD-10-CM

## 2016-11-09 DIAGNOSIS — I10 Essential (primary) hypertension: Secondary | ICD-10-CM

## 2016-11-09 DIAGNOSIS — Z13220 Encounter for screening for lipoid disorders: Secondary | ICD-10-CM | POA: Diagnosis not present

## 2016-11-09 MED ORDER — NITROGLYCERIN 0.4 MG SL SUBL
0.4000 mg | SUBLINGUAL_TABLET | SUBLINGUAL | 3 refills | Status: DC | PRN
Start: 2016-11-09 — End: 2018-06-11

## 2016-11-09 MED ORDER — PANTOPRAZOLE SODIUM 40 MG PO TBEC: 40.0000 mg | DELAYED_RELEASE_TABLET | Freq: Two times a day (BID) | ORAL | 11 refills | Status: DC

## 2016-11-09 MED ORDER — CLOPIDOGREL BISULFATE 75 MG PO TABS: 75.0000 mg | ORAL_TABLET | Freq: Every day | ORAL | 3 refills | Status: DC

## 2016-11-09 MED ORDER — NITROGLYCERIN 0.4 MG SL SUBL: 0.4000 mg | SUBLINGUAL_TABLET | SUBLINGUAL | 0 refills | Status: DC | PRN

## 2016-11-09 NOTE — Patient Instructions (Signed)
Medication Instructions:  Your physician has recommended you make the following change in your medication: 1.) STOP the nexium. 2.) START protonix 80 mg daily. 3.) STOP the brillinta on 11/26/16. 4.) START the plavix on 11/27/16  Labwork: Your physician recommends that you return for lab work in: 4-6 weeks for the LFT  Testing/Procedures: None Ordered   Follow-Up: Your physician recommends that you schedule a follow-up appointment in: 3 months with Dr. Angelena Form  Any Other Special Instructions Will Be Listed Below (If Applicable).     If you need a refill on your cardiac medications before your next appointment, please call your pharmacy.  Thank you for choosing [facility]

## 2016-11-10 DIAGNOSIS — I2510 Atherosclerotic heart disease of native coronary artery without angina pectoris: Secondary | ICD-10-CM | POA: Diagnosis not present

## 2016-11-25 ENCOUNTER — Other Ambulatory Visit: Payer: Self-pay | Admitting: Physician Assistant

## 2016-11-25 MED ORDER — ATORVASTATIN CALCIUM 80 MG PO TABS: 80.0000 mg | ORAL_TABLET | Freq: Every day | ORAL | 3 refills | Status: DC

## 2016-11-25 MED ORDER — METOPROLOL TARTRATE 25 MG PO TABS: 25.0000 mg | ORAL_TABLET | Freq: Two times a day (BID) | ORAL | 3 refills | Status: DC

## 2016-11-25 MED ORDER — TICAGRELOR 90 MG PO TABS: 90.0000 mg | ORAL_TABLET | Freq: Two times a day (BID) | ORAL | 3 refills | Status: DC

## 2016-11-25 NOTE — Telephone Encounter (Signed)
Pt's medication was sent to pt's pharmacy as requested. Confirmation received.  °

## 2016-12-21 ENCOUNTER — Other Ambulatory Visit: Payer: 59 | Admitting: *Deleted

## 2016-12-21 ENCOUNTER — Other Ambulatory Visit: Payer: Self-pay | Admitting: *Deleted

## 2016-12-21 ENCOUNTER — Encounter (INDEPENDENT_AMBULATORY_CARE_PROVIDER_SITE_OTHER): Payer: Self-pay

## 2016-12-21 DIAGNOSIS — I2510 Atherosclerotic heart disease of native coronary artery without angina pectoris: Secondary | ICD-10-CM

## 2016-12-21 LAB — HEPATIC FUNCTION PANEL
ALT: 50 IU/L — ABNORMAL HIGH (ref 0–44)
AST: 26 IU/L (ref 0–40)
Albumin: 4 g/dL (ref 3.5–5.5)
Alkaline Phosphatase: 135 IU/L — ABNORMAL HIGH (ref 39–117)
Bilirubin Total: 0.8 mg/dL (ref 0.0–1.2)
Bilirubin, Direct: 0.21 mg/dL (ref 0.00–0.40)
Total Protein: 6.3 g/dL (ref 6.0–8.5)

## 2016-12-21 LAB — LIPID PANEL
Chol/HDL Ratio: 3 ratio (ref 0.0–5.0)
Cholesterol, Total: 115 mg/dL (ref 100–199)
HDL: 38 mg/dL — ABNORMAL LOW (ref >39)
LDL Calculated: 58 mg/dL (ref 0–99)
Triglycerides: 97 mg/dL (ref 0–149)
VLDL Cholesterol Cal: 19 mg/dL (ref 5–40)

## 2016-12-22 ENCOUNTER — Other Ambulatory Visit: Payer: Self-pay | Admitting: Nurse Practitioner

## 2016-12-22 ENCOUNTER — Telehealth: Payer: Self-pay | Admitting: Cardiovascular Disease

## 2016-12-22 DIAGNOSIS — R7989 Other specified abnormal findings of blood chemistry: Secondary | ICD-10-CM

## 2016-12-22 DIAGNOSIS — R945 Abnormal results of liver function studies: Principal | ICD-10-CM

## 2016-12-22 NOTE — Telephone Encounter (Signed)
Follow Up: ° ° °Returning your call,concerning his lab results. °

## 2016-12-22 NOTE — Telephone Encounter (Signed)
Reviewed results and plan of care with patient who verbalized understanding and agreement. He denies use of Tylenol or alcohol. He is scheduled for repeat LFT on Friday 8/3.

## 2016-12-30 ENCOUNTER — Other Ambulatory Visit: Payer: 59 | Admitting: *Deleted

## 2016-12-30 DIAGNOSIS — R945 Abnormal results of liver function studies: Principal | ICD-10-CM

## 2016-12-30 DIAGNOSIS — R7989 Other specified abnormal findings of blood chemistry: Secondary | ICD-10-CM

## 2016-12-30 LAB — HEPATIC FUNCTION PANEL
ALT: 47 IU/L — ABNORMAL HIGH (ref 0–44)
AST: 22 IU/L (ref 0–40)
Albumin: 3.9 g/dL (ref 3.5–5.5)
Alkaline Phosphatase: 131 IU/L — ABNORMAL HIGH (ref 39–117)
Bilirubin Total: 0.7 mg/dL (ref 0.0–1.2)
Bilirubin, Direct: 0.19 mg/dL (ref 0.00–0.40)
Total Protein: 6.2 g/dL (ref 6.0–8.5)

## 2017-01-06 ENCOUNTER — Telehealth: Payer: Self-pay

## 2017-01-06 DIAGNOSIS — Z79899 Other long term (current) drug therapy: Secondary | ICD-10-CM

## 2017-01-06 MED ORDER — ATORVASTATIN CALCIUM 40 MG PO TABS: 40.0000 mg | ORAL_TABLET | Freq: Every day | ORAL | 3 refills | Status: DC

## 2017-01-06 NOTE — Telephone Encounter (Signed)
Per lab results from [facility] we are decreasing Atorvastatin to 40 mg qpm. Patient will be back in on 8/24 for repeat blood work.

## 2017-02-13 ENCOUNTER — Ambulatory Visit (INDEPENDENT_AMBULATORY_CARE_PROVIDER_SITE_OTHER): Payer: 59 | Admitting: Cardiovascular Disease

## 2017-02-13 ENCOUNTER — Encounter: Payer: Self-pay | Admitting: Cardiovascular Disease

## 2017-02-13 VITALS — BP 114/70 | HR 68 | Ht 74.5 in | Wt 241.4 lb

## 2017-02-13 DIAGNOSIS — I2510 Atherosclerotic heart disease of native coronary artery without angina pectoris: Secondary | ICD-10-CM | POA: Diagnosis not present

## 2017-02-13 MED ORDER — CLOPIDOGREL BISULFATE 75 MG PO TABS: 75.0000 mg | ORAL_TABLET | Freq: Every day | ORAL | 3 refills | Status: DC

## 2017-02-13 NOTE — Patient Instructions (Signed)
Medication Instructions:  Your physician has recommended you make the following change in your medication:  Stop Brilinta. Start Clopidogrel 75 mg by mouth daily.    Labwork: none  Testing/Procedures: Your physician has requested that you have an echocardiogram. Echocardiography is a painless test that uses sound waves to create images of your heart. It provides your doctor with information about the size and shape of your heart and how well your heart's chambers and valves are working. This procedure takes approximately one hour. There are no restrictions for this procedure.    Follow-Up: Your physician recommends that you schedule a follow-up appointment in: mid December.     Any Other Special Instructions Will Be Listed Below (If Applicable).     If you need a refill on your cardiac medications before your next appointment, please call your pharmacy.

## 2017-02-13 NOTE — Progress Notes (Signed)
Chief Complaint  Patient presents with  . Follow-up    CAD   History of Present Illness: 56 yo male with history of HTN, CAD who is here today for follow up. He was admitted to [facility] May 2018 with a NSTEMI. Cardiac cath May 2018 with severe stenosis in the diagonal branch treated with balloon angioplasty. He was also found to have 25% ostial LAD stenosis, 50% mid LAD stenosis, 90% distal Circumflex stenosis, very small caliber. LVEF normal by LV gram. He was treated with ASA and Brilinta for one month. He is now on ASA and Plavix.   He is here today for follow up of his CAD. The patient denies any chest pain, palpitations, lower extremity edema, orthopnea, PND, dizziness, near syncope or syncope. He continues to have dyspnea on Brilinta. This occurs at night while lying on his left side and also with moderate to heavy exertion. Weight up slightly but he has not been exercising.   Primary Care Physician: Shirline Frees, MD   Past Medical History:  Diagnosis Date  . Arthritis   . Chest pain with moderate risk for cardiac etiology 10/26/2016  . Diverticulosis   . Erosive esophagitis    LA class grade B 2007  . Esophageal stricture   . GERD (gastroesophageal reflux disease)   . Hiatal hernia   . Hypertension   . Sarcoidosis   . 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 COLONOSCOPY    . CORONARY BALLOON ANGIOPLASTY N/A 10/27/2016   Procedure: Coronary Balloon Angioplasty;  Surgeon: Jettie Booze, MD;  Location: [facility];  Service: Cardiovascular;  Laterality: N/A;  . HERNIA REPAIR    . LEFT HEART CATH AND CORONARY ANGIOGRAPHY N/A 10/27/2016   Procedure: Left Heart Cath and Coronary Angiography;  Surgeon: Jettie Booze, MD;  Location: [facility];  Service: Cardiovascular;  Laterality: N/A;  . TONSILLECTOMY AND ADENOIDECTOMY    . UPPER GASTROINTESTINAL ENDOSCOPY      Current Outpatient Prescriptions    Medication Sig Dispense Refill  . aspirin EC 81 MG EC tablet Take 1 tablet (81 mg total) by mouth daily. 1 tablet 0  . atorvastatin (LIPITOR) 40 MG tablet Take 1 tablet (40 mg total) by mouth daily. 90 tablet 3  . clopidogrel (PLAVIX) 75 MG tablet Take 1 tablet (75 mg total) by mouth daily. 90 tablet 3  . metoprolol tartrate (LOPRESSOR) 25 MG tablet Take 1 tablet (25 mg total) by mouth 2 (two) times daily. 180 tablet 3  . nitroGLYCERIN (NITROSTAT) 0.4 MG SL tablet Place 1 tablet (0.4 mg total) under the tongue every 5 (five) minutes as needed for chest pain. 25 tablet 3  . pantoprazole (PROTONIX) 40 MG tablet Take 1 tablet (40 mg total) by mouth 2 (two) times daily. 30 tablet 11   No current facility-administered medications for this visit.     No Known Allergies  Social History   Social History  . Marital status: Married    Spouse name: N/A  . Number of children: N/A  . Years of education: N/A   Occupational History  . Research scientist (life sciences) [employer] History Main Topics  . Smoking status: Never Smoker  . Smokeless tobacco: Never Used  . Alcohol use No  . Drug use: No  . Sexual activity: Not on file   Other Topics Concern  . Not on file   Social History Narrative  Lives with wife in [place], near [place].    Family History  Problem Relation Age of Onset  . Heart disease Mother        CABG age 18, born 57  . Stomach cancer Father 92  . Leukemia Maternal Grandmother   . Heart attack Maternal Aunt 35  . Colon cancer Neg Hx   . Diabetes Neg Hx   . Kidney disease Neg Hx   . Liver disease Neg Hx   . Colon polyps Neg Hx   . Rectal cancer Neg Hx     Review of Systems:  As stated in the HPI and otherwise negative.   BP 114/70   Pulse 68   Ht 6' 2.5" (1.892 m)   Wt 241 lb 6.4 oz (109.5 kg)   SpO2 95%   BMI 30.58 kg/m   Physical Examination: General: Well developed, well nourished, NAD  HEENT: OP clear, mucus membranes moist  SKIN: warm, dry. No  rashes. Neuro: No focal deficits  Musculoskeletal: Muscle strength 5/5 all ext  Psychiatric: Mood and affect normal  Neck: No JVD, no carotid bruits, no thyromegaly, no lymphadenopathy.  Lungs:Clear bilaterally, no wheezes, rhonci, crackles Cardiovascular: Regular rate and rhythm. No murmurs, gallops or rubs. Abdomen:Soft. Bowel sounds present. Non-tender.  Extremities: No lower extremity edema. Pulses are 2 + in the bilateral DP/PT.  Cardiac cath 10/27/16: Anterior Descending  Ost LAD lesion, 25% stenosed.  Prox LAD lesion, 50% stenosed.  Mid LAD to Dist LAD lesion, 25% stenosed.  First Diagonal Branch  Ost 1st Diag to 1st Diag lesion, 99% stenosed. The lesion is type C and eccentric.  Angioplasty: Lesion crossed with guidewire using a WIRE HI TORQ BMW 190CM. Angioplasty alone was performed using a BALLOON EUPHORA RX 2.0X15. Maximum pressure: 14 atm. The pre-interventional distal flow is decreased (TIMI 2). The post-interventional distal flow is normal (TIMI 3). The intervention was successful . No complications occurred at this lesion. Prowater placed in LAD to protect this vessel.  There is a 30% residual stenosis post intervention.  Ramus Intermedius  Vessel is large.  Left Circumflex  Dist Cx lesion, 90% stenosed.  Right Coronary Artery  Ost RCA lesion, 25% stenosed.  Mid RCA lesion, 25% stenosed.  Dist RCA lesion, 25% stenosed.  Wall Motion   Resting               Left Heart   Left Ventricle The left ventricular size is normal. The left ventricular systolic function is normal. LV end diastolic pressure is normal. The left ventricular ejection fraction is 55-65% by visual estimate. There are LV function abnormalities due to segmental dysfunction.    Aortic Valve There is no aortic valve stenosis.    Coronary Diagrams   Diagnostic Diagram       Post-Intervention Diagram          EKG:  EKG is not ordered today. The ekg ordered today demonstrates   Recent  Labs: 10/28/2016: BUN 10; Creatinine, Ser 0.84; Hemoglobin 14.9; Platelets 210; Potassium 3.6; Sodium 138 12/30/2016: ALT 47   Lipid Panel    Component Value Date/Time   CHOL 115 12/21/2016 0911   TRIG 97 12/21/2016 0911   HDL 38 (L) 12/21/2016 0911   CHOLHDL 3.0 12/21/2016 0911   LDLCALC 58 12/21/2016 0911     Wt Readings from Last 3 Encounters:  02/13/17 241 lb 6.4 oz (109.5 kg)  11/09/16 233 lb 12.8 oz (106.1 kg)  10/27/16 230 lb 2.6 oz (104.4 kg)  Other studies Reviewed: Additional studies/ records that were reviewed today include: . Review of the above records demonstrates:   Assessment and Plan:   1. CAD without angina: He is having no chest pain suggestive of angina. He is on good medical therapy with ASA and Brilinta, beta blocker and statin. Lipids well controlled. BP well controlled. He is having dyspnea. I think this may be due to his Brilinta. I will stop Brilinta today and start Plavix 75 mg po Qdaily. I will arrange an echo to assess LV size and function and exclude valvular disease.   Current medicines are reviewed at length with the patient today.  The patient does not have concerns regarding medicines.  The following changes have been made:  no change  Labs/ tests ordered today include:   Orders Placed This Encounter  Procedures  . ECHOCARDIOGRAM COMPLETE     Disposition:   FU with me in 3 months   Signed, Lauree Chandler, MD 02/13/2017 10:26 AM    [facility] [address], [place], [place]  [address] Phone: [phone]; Fax: [phone]

## 2017-02-20 ENCOUNTER — Ambulatory Visit (HOSPITAL_COMMUNITY): Payer: 59 | Attending: Cardiology

## 2017-02-20 ENCOUNTER — Other Ambulatory Visit: Payer: Self-pay

## 2017-02-20 DIAGNOSIS — I252 Old myocardial infarction: Secondary | ICD-10-CM | POA: Insufficient documentation

## 2017-02-20 DIAGNOSIS — I2510 Atherosclerotic heart disease of native coronary artery without angina pectoris: Secondary | ICD-10-CM | POA: Diagnosis not present

## 2017-02-20 DIAGNOSIS — I10 Essential (primary) hypertension: Secondary | ICD-10-CM | POA: Insufficient documentation

## 2017-04-05 DIAGNOSIS — R21 Rash and other nonspecific skin eruption: Secondary | ICD-10-CM | POA: Diagnosis not present

## 2017-04-14 DIAGNOSIS — L814 Other melanin hyperpigmentation: Secondary | ICD-10-CM | POA: Diagnosis not present

## 2017-04-14 DIAGNOSIS — D225 Melanocytic nevi of trunk: Secondary | ICD-10-CM | POA: Diagnosis not present

## 2017-04-14 DIAGNOSIS — L309 Dermatitis, unspecified: Secondary | ICD-10-CM | POA: Diagnosis not present

## 2017-04-14 DIAGNOSIS — D485 Neoplasm of uncertain behavior of skin: Secondary | ICD-10-CM | POA: Diagnosis not present

## 2017-04-14 DIAGNOSIS — L570 Actinic keratosis: Secondary | ICD-10-CM | POA: Diagnosis not present

## 2017-05-06 DIAGNOSIS — J069 Acute upper respiratory infection, unspecified: Secondary | ICD-10-CM | POA: Diagnosis not present

## 2017-05-12 ENCOUNTER — Encounter: Payer: Self-pay | Admitting: Cardiovascular Disease

## 2017-05-12 ENCOUNTER — Ambulatory Visit (INDEPENDENT_AMBULATORY_CARE_PROVIDER_SITE_OTHER): Payer: 59 | Admitting: Cardiovascular Disease

## 2017-05-12 VITALS — BP 130/84 | HR 87 | Ht 74.5 in | Wt 242.0 lb

## 2017-05-12 DIAGNOSIS — Z23 Encounter for immunization: Secondary | ICD-10-CM | POA: Diagnosis not present

## 2017-05-12 DIAGNOSIS — Z0000 Encounter for general adult medical examination without abnormal findings: Secondary | ICD-10-CM | POA: Diagnosis not present

## 2017-05-12 DIAGNOSIS — I2510 Atherosclerotic heart disease of native coronary artery without angina pectoris: Secondary | ICD-10-CM | POA: Diagnosis not present

## 2017-05-12 DIAGNOSIS — E7800 Pure hypercholesterolemia, unspecified: Secondary | ICD-10-CM | POA: Diagnosis not present

## 2017-05-12 NOTE — Progress Notes (Signed)
Chief Complaint  Patient presents with  . Coronary Artery Disease   History of Present Illness: 56 yo male with history of HTN, CAD who is here today for follow up. He was admitted to [facility] May 2018 with a NSTEMI. Cardiac cath May 2018 with a severe stenosis in the diagonal branch treated with balloon angioplasty. He was also found to have 25% ostial LAD stenosis, 50% mid LAD stenosis, 90% distal Circumflex stenosis (very small caliber vessel). LVEF normal by LV gram. He was treated with ASA and Brilinta for one month. He is now on ASA and Plavix. Echo 02/20/17 with normal LV size and function, LVEF=60-65%, no valve disease.   He is here today for follow up. The patient denies any chest pain, dyspnea, palpitations, lower extremity edema, orthopnea, PND, dizziness, near syncope or syncope.   Primary Care Physician: Shirline Frees, MD   Past Medical History:  Diagnosis Date  . Arthritis   . Chest pain with moderate risk for cardiac etiology 10/26/2016  . Diverticulosis   . Erosive esophagitis    LA class grade B 2007  . Esophageal stricture   . GERD (gastroesophageal reflux disease)   . Hiatal hernia   . Hypertension   . Sarcoidosis   . 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 COLONOSCOPY    . CORONARY BALLOON ANGIOPLASTY N/A 10/27/2016   Procedure: Coronary Balloon Angioplasty;  Surgeon: Jettie Booze, MD;  Location: [facility];  Service: Cardiovascular;  Laterality: N/A;  . HERNIA REPAIR    . LEFT HEART CATH AND CORONARY ANGIOGRAPHY N/A 10/27/2016   Procedure: Left Heart Cath and Coronary Angiography;  Surgeon: Jettie Booze, MD;  Location: [facility];  Service: Cardiovascular;  Laterality: N/A;  . TONSILLECTOMY AND ADENOIDECTOMY    . UPPER GASTROINTESTINAL ENDOSCOPY      Current Outpatient Medications  Medication Sig Dispense Refill  . aspirin EC 81 MG EC tablet Take 1 tablet (81 mg total) by  mouth daily. 1 tablet 0  . atorvastatin (LIPITOR) 40 MG tablet Take 1 tablet (40 mg total) by mouth daily. 90 tablet 3  . clopidogrel (PLAVIX) 75 MG tablet Take 1 tablet (75 mg total) by mouth daily. 90 tablet 3  . esomeprazole (NEXIUM) 40 MG capsule Take 40 mg by mouth daily at 12 noon.    . metoprolol tartrate (LOPRESSOR) 25 MG tablet Take 1 tablet (25 mg total) by mouth 2 (two) times daily. 180 tablet 3  . nitroGLYCERIN (NITROSTAT) 0.4 MG SL tablet Place 1 tablet (0.4 mg total) under the tongue every 5 (five) minutes as needed for chest pain. 25 tablet 3   No current facility-administered medications for this visit.     No Known Allergies  Social History   Socioeconomic History  . Marital status: Married    Spouse name: Not on file  . Number of children: Not on file  . Years of education: Not on file  . Highest education level: Not on file  Social Needs  . Financial resource strain: Not on file  . Food insecurity - worry: Not on file  . Food insecurity - inability: Not on file  . Transportation needs - medical: Not on file  . Transportation needs - non-medical: Not on file  Occupational History  . Occupation: Psychologist, prison and probation services: [employer]  Tobacco Use  . Smoking status: Never Smoker  . Smokeless tobacco: Never Used  Substance and Sexual Activity  . Alcohol use: No  . Drug use: No  . Sexual activity: Not on file  Other Topics Concern  . Not on file  Social History Narrative   Lives with wife in [place], near [place].    Family History  Problem Relation Age of Onset  . Heart disease Mother        CABG age 47, born 41  . Stomach cancer Father 7  . Leukemia Maternal Grandmother   . Heart attack Maternal Aunt 35  . Colon cancer Neg Hx   . Diabetes Neg Hx   . Kidney disease Neg Hx   . Liver disease Neg Hx   . Colon polyps Neg Hx   . Rectal cancer Neg Hx     Review of Systems:  As stated in the HPI and otherwise negative.   BP 130/84   Pulse  87   Ht 6' 2.5" (1.892 m)   Wt 242 lb (109.8 kg)   SpO2 93%   BMI 30.66 kg/m   Physical Examination:  General: Well developed, well nourished, NAD  HEENT: OP clear, mucus membranes moist  SKIN: warm, dry. No rashes. Neuro: No focal deficits  Musculoskeletal: Muscle strength 5/5 all ext  Psychiatric: Mood and affect normal  Neck: No JVD, no carotid bruits, no thyromegaly, no lymphadenopathy.  Lungs:Clear bilaterally, no wheezes, rhonci, crackles Cardiovascular: Regular rate and rhythm. No murmurs, gallops or rubs. Abdomen:Soft. Bowel sounds present. Non-tender.  Extremities: No lower extremity edema. Pulses are 2 + in the bilateral DP/PT.  Cardiac cath 10/27/16: Anterior Descending  Ost LAD lesion, 25% stenosed.  Prox LAD lesion, 50% stenosed.  Mid LAD to Dist LAD lesion, 25% stenosed.  First Diagonal Branch  Ost 1st Diag to 1st Diag lesion, 99% stenosed. The lesion is type C and eccentric.  Angioplasty: Lesion crossed with guidewire using a WIRE HI TORQ BMW 190CM. Angioplasty alone was performed using a BALLOON EUPHORA RX 2.0X15. Maximum pressure: 14 atm. The pre-interventional distal flow is decreased (TIMI 2). The post-interventional distal flow is normal (TIMI 3). The intervention was successful . No complications occurred at this lesion. Prowater placed in LAD to protect this vessel.  There is a 30% residual stenosis post intervention.  Ramus Intermedius  Vessel is large.  Left Circumflex  Dist Cx lesion, 90% stenosed.  Right Coronary Artery  Ost RCA lesion, 25% stenosed.  Mid RCA lesion, 25% stenosed.  Dist RCA lesion, 25% stenosed.  Wall Motion   Resting               Left Heart   Left Ventricle The left ventricular size is normal. The left ventricular systolic function is normal. LV end diastolic pressure is normal. The left ventricular ejection fraction is 55-65% by visual estimate. There are LV function abnormalities due to segmental dysfunction.    Aortic  Valve There is no aortic valve stenosis.    Coronary Diagrams   Diagnostic Diagram       Post-Intervention Diagram        Echo 02/20/17: - Left ventricle: The cavity size was normal. Wall thickness was   increased in a pattern of mild LVH. Systolic function was normal.   The estimated ejection fraction was in the range of 60% to 65%.   Wall motion was normal; there were no regional wall motion   abnormalities. Left ventricular diastolic function parameters   were normal.  EKG:  EKG is not ordered today. The ekg ordered today demonstrates  Recent Labs: 10/28/2016: BUN 10; Creatinine, Ser 0.84; Hemoglobin 14.9; Platelets 210; Potassium 3.6; Sodium 138 12/30/2016: ALT 47   Lipid Panel    Component Value Date/Time   CHOL 115 12/21/2016 0911   TRIG 97 12/21/2016 0911   HDL 38 (L) 12/21/2016 0911   CHOLHDL 3.0 12/21/2016 0911   LDLCALC 58 12/21/2016 0911     Wt Readings from Last 3 Encounters:  05/12/17 242 lb (109.8 kg)  02/13/17 241 lb 6.4 oz (109.5 kg)  11/09/16 233 lb 12.8 oz (106.1 kg)     Other studies Reviewed: Additional studies/ records that were reviewed today include: . Review of the above records demonstrates:   Assessment and Plan:   1. CAD without angina: No chest pain that is worrisome for angina. Echo with normal LV function September 2018. Will continue ASA/Plavix/beta blocker and statin.   Current medicines are reviewed at length with the patient today.  The patient does not have concerns regarding medicines.  The following changes have been made:  no change  Labs/ tests ordered today include:   No orders of the defined types were placed in this encounter.    Disposition:   FU with me in 12 months   Signed, Lauree Chandler, MD 05/12/2017 4:43 PM    [facility] [address], [place], [place]  [address] Phone: [phone]; Fax: [phone]

## 2017-05-12 NOTE — Patient Instructions (Signed)

## 2017-05-25 ENCOUNTER — Telehealth: Payer: Self-pay | Admitting: Cardiovascular Disease

## 2017-05-25 DIAGNOSIS — E7800 Pure hypercholesterolemia, unspecified: Secondary | ICD-10-CM

## 2017-05-25 DIAGNOSIS — I2510 Atherosclerotic heart disease of native coronary artery without angina pectoris: Secondary | ICD-10-CM

## 2017-05-25 MED ORDER — ATORVASTATIN CALCIUM 80 MG PO TABS: 80.0000 mg | ORAL_TABLET | Freq: Every day | ORAL | 1 refills | Status: DC

## 2017-05-25 NOTE — Telephone Encounter (Signed)
New message ° ° ° ° °Patient returning call for results. Please call °

## 2017-05-25 NOTE — Telephone Encounter (Signed)
Pt tells me he received a phone call from our office to go over test results.  However, he isn't sure what that was in reference to b/c he hasn't had any recent testing. Informed that we received blood work from PCP office and LDL is not at goal. Advised to increase Lipitor to 80 mg and repeat Lipid & LFTs in 3 months -- recall placed for lab per pt request. [facility] pt request.

## 2017-06-16 DIAGNOSIS — D485 Neoplasm of uncertain behavior of skin: Secondary | ICD-10-CM | POA: Diagnosis not present

## 2017-06-16 DIAGNOSIS — L570 Actinic keratosis: Secondary | ICD-10-CM | POA: Diagnosis not present

## 2017-06-16 DIAGNOSIS — D2239 Melanocytic nevi of other parts of face: Secondary | ICD-10-CM | POA: Diagnosis not present

## 2018-01-27 ENCOUNTER — Other Ambulatory Visit: Payer: Self-pay | Admitting: Physician Assistant

## 2018-02-11 ENCOUNTER — Other Ambulatory Visit: Payer: Self-pay | Admitting: Cardiovascular Disease

## 2018-02-13 ENCOUNTER — Other Ambulatory Visit: Payer: Self-pay | Admitting: Physician Assistant

## 2018-02-13 DIAGNOSIS — L814 Other melanin hyperpigmentation: Secondary | ICD-10-CM | POA: Diagnosis not present

## 2018-02-13 DIAGNOSIS — D1801 Hemangioma of skin and subcutaneous tissue: Secondary | ICD-10-CM | POA: Diagnosis not present

## 2018-02-13 DIAGNOSIS — D225 Melanocytic nevi of trunk: Secondary | ICD-10-CM | POA: Diagnosis not present

## 2018-03-14 DIAGNOSIS — M10079 Idiopathic gout, unspecified ankle and foot: Secondary | ICD-10-CM | POA: Diagnosis not present

## 2018-03-16 ENCOUNTER — Other Ambulatory Visit: Payer: Self-pay | Admitting: Cardiovascular Disease

## 2018-03-16 NOTE — Telephone Encounter (Signed)
Outpatient Medication Detail    Disp Refills Start End   clopidogrel (PLAVIX) 75 MG tablet 90 tablet 0 02/12/2018    Sig: TAKE 1 TABLET BY MOUTH EVERY DAY   Sent to pharmacy as: clopidogrel (PLAVIX) 75 MG tablet   E-Prescribing Status: Receipt confirmed by pharmacy (02/12/2018 3:17 PM EDT)   Pharmacy   [id number] - [place], [place] - [place]

## 2018-03-22 ENCOUNTER — Other Ambulatory Visit: Payer: Self-pay | Admitting: Cardiovascular Disease

## 2018-03-23 ENCOUNTER — Other Ambulatory Visit: Payer: Self-pay | Admitting: Cardiovascular Disease

## 2018-03-23 MED ORDER — CLOPIDOGREL BISULFATE 75 MG PO TABS: 75.0000 mg | ORAL_TABLET | Freq: Every day | ORAL | 0 refills | Status: DC

## 2018-03-23 NOTE — Addendum Note (Signed)
Addended by: Juventino Slovak on: 03/23/2018 02:37 PM   Modules accepted: Orders

## 2018-05-04 DIAGNOSIS — M7662 Achilles tendinitis, left leg: Secondary | ICD-10-CM | POA: Insufficient documentation

## 2018-05-04 DIAGNOSIS — M79672 Pain in left foot: Secondary | ICD-10-CM | POA: Diagnosis not present

## 2018-05-04 DIAGNOSIS — M7661 Achilles tendinitis, right leg: Secondary | ICD-10-CM | POA: Insufficient documentation

## 2018-05-04 DIAGNOSIS — M79671 Pain in right foot: Secondary | ICD-10-CM | POA: Diagnosis not present

## 2018-05-14 DIAGNOSIS — E291 Testicular hypofunction: Secondary | ICD-10-CM | POA: Diagnosis not present

## 2018-05-14 DIAGNOSIS — E7800 Pure hypercholesterolemia, unspecified: Secondary | ICD-10-CM | POA: Diagnosis not present

## 2018-05-14 DIAGNOSIS — Z125 Encounter for screening for malignant neoplasm of prostate: Secondary | ICD-10-CM | POA: Diagnosis not present

## 2018-05-14 DIAGNOSIS — M2550 Pain in unspecified joint: Secondary | ICD-10-CM | POA: Diagnosis not present

## 2018-05-14 DIAGNOSIS — I10 Essential (primary) hypertension: Secondary | ICD-10-CM | POA: Diagnosis not present

## 2018-05-14 DIAGNOSIS — K219 Gastro-esophageal reflux disease without esophagitis: Secondary | ICD-10-CM | POA: Diagnosis not present

## 2018-05-14 DIAGNOSIS — Z0000 Encounter for general adult medical examination without abnormal findings: Secondary | ICD-10-CM | POA: Diagnosis not present

## 2018-06-11 ENCOUNTER — Encounter: Payer: Self-pay | Admitting: Cardiology

## 2018-06-11 ENCOUNTER — Ambulatory Visit (INDEPENDENT_AMBULATORY_CARE_PROVIDER_SITE_OTHER): Payer: 59 | Admitting: Cardiology

## 2018-06-11 VITALS — BP 114/68 | HR 81 | Ht 74.5 in | Wt 245.8 lb

## 2018-06-11 DIAGNOSIS — I2510 Atherosclerotic heart disease of native coronary artery without angina pectoris: Secondary | ICD-10-CM | POA: Diagnosis not present

## 2018-06-11 DIAGNOSIS — R079 Chest pain, unspecified: Secondary | ICD-10-CM | POA: Diagnosis not present

## 2018-06-11 DIAGNOSIS — I10 Essential (primary) hypertension: Secondary | ICD-10-CM | POA: Diagnosis not present

## 2018-06-11 MED ORDER — NITROGLYCERIN 0.4 MG SL SUBL: 0.4000 mg | SUBLINGUAL_TABLET | SUBLINGUAL | 3 refills | Status: DC | PRN

## 2018-06-11 NOTE — Progress Notes (Signed)
06/11/2018 Alexander [name]   [birth year]  [id number]  Primary Physician Shirline Frees, MD Primary Cardiologist: Dr. Angelena Form  Electrophysiologist: none   Reason for Visit/CC: Yearly F/u for CAD   HPI:  Mr. Thivierge is a 58 yo male with history of HTN and CAD, followed by Dr. Angelena Form, who is here today for his yearly follow up. He was admitted to [facility] in May 2018 with a NSTEMI. Cardiac cath showed severe stenosis in the diagonal branch treated with balloon angioplasty. He was also found to have 25% ostial LAD stenosis, 50% mid LAD stenosis, 90% distal Circumflex stenosis (very small caliber vessel). LVEF normal by LV gram. He was treated with ASA and Brilinta for one month. He was switched to ASA and Plavix. Echo 02/20/17 with normal LV size and function, LVEF=60-65%, no valve disease.   He is here today with his wife. Today in clinic, he reports that he has done fairly well since his last OV, however, he has experienced some exertional CP with moderate to heavy physical activity. He denies resting CP. No associated dyspnea.  CP resolves with rest. He has also used SL NTG a few times in the last year. His CP is variable. Yesterday, he notes he went for a walk in the neighborhood, with a few hills, but no symptoms. He and his wife will be leaving for a trip to [place] in 3 weeks and his wife is concerned. He is currently CP free. Resting EKG shows no ischemic abnormalities and BP is well controlled.    He denies other cardiac symptoms, including no palpitations, dizziness, syncope/ near syncope, LEE, orthopnea and PND. He has been compliant with most of his meds, except Plavix. He states he self discontinued this several months ago b/c it was causing dyspnea. Symptoms resolved once he stopped. He has been compliant w/ ASA, metoprolol and Lipitor.     Cardiac Studies  LHC 2018 Conclusion     Ost LAD lesion, 25 %stenosed.  Prox to mid LAD lesion, 50 %stenosed, begins just before culprit  diagonal lesion.  Dist Cx lesion, 90 %stenosed. This is a very small vessel, not amenable to PCI.  Mild RCA disease.  Mid LAD to Dist LAD lesion, 25 %stenosed.  The left ventricular systolic function is normal.  LV end diastolic pressure is normal.  The left ventricular ejection fraction is 55-65% by visual estimate. Area of anterolateral hypokinesis.  There is no aortic valve stenosis.  Ost 1st Diag to 1st Diag lesion, 99 %stenosed.  Post intervention with a 2.0 balloon, there is a 30% residual stenosis and initial TIMI 2 flow improved to TIMI 3 flow.   Coronary Diagrams   Diagnostic  Dominance: Right    Intervention        2D Echo 2018  Study Conclusions  - Left ventricle: The cavity size was normal. Wall thickness was   increased in a pattern of mild LVH. Systolic function was normal.   The estimated ejection fraction was in the range of 60% to 65%.   Wall motion was normal; there were no regional wall motion   abnormalities. Left ventricular diastolic function parameters   were normal.    Current Meds  Medication Sig  . aspirin EC 81 MG EC tablet Take 1 tablet (81 mg total) by mouth daily.  Marland Kitchen atorvastatin (LIPITOR) 40 MG tablet Take 1 tablet (40 mg total) by mouth daily. Please make yearly appt with Dr. Angelena Form for December for future refills. 1st attempt  .  esomeprazole (NEXIUM) 40 MG capsule Take 40 mg by mouth daily at 12 noon.  . metoprolol tartrate (LOPRESSOR) 25 MG tablet TAKE 1 TABLET BY MOUTH TWICE A DAY   No Known Allergies Past Medical History:  Diagnosis Date  . Arthritis   . Chest pain with moderate risk for cardiac etiology 10/26/2016  . Diverticulosis   . Erosive esophagitis    LA class grade B 2007  . Esophageal stricture   . GERD (gastroesophageal reflux disease)   . Hiatal hernia   . Hypertension   . Sarcoidosis   . 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 Heart disease Mother          CABG age 59, born 18  . Stomach cancer Father 54  . Leukemia Maternal Grandmother   . Heart attack Maternal Aunt 35  . Colon cancer Neg Hx   . Diabetes Neg Hx   . Kidney disease Neg Hx   . Liver disease Neg Hx   . Colon polyps Neg Hx   . Rectal cancer Neg Hx    Past Surgical History:  Procedure Laterality Date  . COLONOSCOPY    . CORONARY BALLOON ANGIOPLASTY N/A 10/27/2016   Procedure: Coronary Balloon Angioplasty;  Surgeon: Jettie Booze, MD;  Location: [facility];  Service: Cardiovascular;  Laterality: N/A;  . HERNIA REPAIR    . LEFT HEART CATH AND CORONARY ANGIOGRAPHY N/A 10/27/2016   Procedure: Left Heart Cath and Coronary Angiography;  Surgeon: Jettie Booze, MD;  Location: [facility];  Service: Cardiovascular;  Laterality: N/A;  . TONSILLECTOMY AND ADENOIDECTOMY    . UPPER GASTROINTESTINAL ENDOSCOPY     Social History   Socioeconomic History  . Marital status: Married    Spouse name: Not on file  . Number of children: Not on file  . Years of education: Not on file  . Highest education level: Not on file  Occupational History  . Occupation: Psychologist, prison and probation services: [employer]  . Financial resource strain: Not on file  . Food insecurity:    Worry: Not on file    Inability: Not on file  . Transportation needs:    Medical: Not on file    Non-medical: Not on file  Tobacco Use  . Smoking status: Never Smoker  . Smokeless tobacco: Never Used  Substance and Sexual Activity  . Alcohol use: No  . Drug use: No  . Sexual activity: Not on file  Lifestyle  . Physical activity:    Days per week: Not on file    Minutes per session: Not on file  . Stress: Not on file  Relationships  . Social connections:    Talks on phone: Not on file    Gets together: Not on file    Attends religious service: Not on file    Active member of club or organization: Not on file    Attends meetings of clubs or organizations: Not on  file    Relationship status: Not on file  . Intimate partner violence:    Fear of current or ex partner: Not on file    Emotionally abused: Not on file    Physically abused: Not on file    Forced sexual activity: Not on file  Other Topics Concern  . Not on file  Social History Narrative   Lives with wife in [place], near [place].  Review of Systems: General: negative for chills, fever, night sweats or weight changes.  Cardiovascular: negative for chest pain, dyspnea on exertion, edema, orthopnea, palpitations, paroxysmal nocturnal dyspnea or shortness of breath Dermatological: negative for rash Respiratory: negative for cough or wheezing Urologic: negative for hematuria Abdominal: negative for nausea, vomiting, diarrhea, bright red blood per rectum, melena, or hematemesis Neurologic: negative for visual changes, syncope, or dizziness All other systems reviewed and are otherwise negative except as noted above.   Physical Exam:  Blood pressure 114/68, pulse 81, height 6' 2.5" (1.892 m), weight 245 lb 12.8 oz (111.5 kg), SpO2 94 %.  General appearance: alert, cooperative and no distress Neck: no carotid bruit and no JVD Lungs: clear to auscultation bilaterally Heart: regular rate and rhythm, S1, S2 normal, no murmur, click, rub or gallop Extremities: extremities normal, atraumatic, no cyanosis or edema Pulses: 2+ and symmetric Skin: Skin color, texture, turgor normal. No rashes or lesions Neurologic: Grossly normal  EKG NSR 70 bpm. No ST abnormalities.  -- personally reviewed   ASSESSMENT AND PLAN:   1. CAD: h/o NSTEMI in 2018 secondary to severe stenosis in the diagonal branch treated with balloon angioplasty. He was also found to have 25% ostial LAD stenosis, 50% mid LAD stenosis, 90% distal Circumflex stenosis (very small caliber vessel). Residual CAD was treated medically. He has he experienced some exertional CP with moderate to heavy physical activity. He denies  resting CP.  CP resolves with rest. He has also used SL NTG a few times in the last year. His CP is variable. Yesterday, he notes he went for a walk in the neighborhood, with a few hills, but no symptoms. He and his wife will be leaving for a trip to [place] in 3 weeks and his wife is concerned. He is currently CP free. Resting EKG shows no ischemic abnormalities and BP is well controlled. We will arrange an exercise myoview to r/o ischemia before his trip overseas. He has been compliant with most of his meds, except Plavix. He states he self discontinued this several months ago b/c it was causing dyspnea. Symptoms resolved once he stopped. He has been compliant w/ ASA, metoprolol and Lipitor.   2. HTN: controlled on current regimen. 114//68 today. Continue metoprolol. Pt advised to hold metoprolol morning of stress test.   3. Lipids: recent lipid panel at PCP office 04/2018 showed controlled LDL at 70 mg/dL. Continue statin therapy w/ Lipitor.   4. Other Preventive: his wife asked about getting a Flu shot prior to their trip. I highly recommended he get a flu shot, but pt is hesitant (states he always get sick after getting one). He is strongly against getting a flu shot today. He will consider and [facility] if he later agrees.   We also discussed compression stockings, proper hydration and mobilization during air travel to reduce risk for DVT.   PLAN    Follow-Up: if stress test is low risk, plan to f/u w/ Dr. Angelena Form in 1 year. If abnormal, we will have him f/u to discuss further w/u.   Justo Hengel Ladoris Gene, MHS [facility] 06/11/2018 8:47 AM

## 2018-06-11 NOTE — Patient Instructions (Signed)
Medication Instructions:  none If you need a refill on your cardiac medications before your next appointment, please call your pharmacy.   Lab work: none If you have labs (blood work) drawn today and your tests are completely normal, you will receive your results only by: Marland Kitchen MyChart Message (if you have MyChart) OR . A paper copy in the mail If you have any lab test that is abnormal or we need to change your treatment, we will call you to review the results.  Testing/Procedures: Your physician has requested that you have en exercise stress myoview. For further information please visit [email or web address]. Please follow instruction sheet, as given.    Follow-Up: At [facility], you and your health needs are our priority.  As part of our continuing mission to provide you with exceptional heart care, we have created designated Provider Care Teams.  These Care Teams include your primary Cardiologist (physician) and Advanced Practice Providers (APPs -  Physician Assistants and Nurse Practitioners) who all work together to provide you with the care you need, when you need it. You will need a follow up appointment in 1 years.  Please call our office 2 months in advance to schedule this appointment.  You may see Dr Angelena Form or one of the following Advanced Practice Providers on your designated Care Team:   Lyda Jester, PA-C Melina Copa, PA-C . Ermalinda Barrios, PA-C  Any Other Special Instructions Will Be Listed Below (If Applicable).

## 2018-06-12 ENCOUNTER — Telehealth (HOSPITAL_COMMUNITY): Payer: Self-pay | Admitting: *Deleted

## 2018-06-12 NOTE — Telephone Encounter (Signed)
Left message on voicemail per DPR in reference to upcoming appointment scheduled on 06/15/18 at 8:00 with detailed instructions given per Myocardial Perfusion Study Information Sheet for the test. LM to arrive 15 minutes early, and that it is imperative to arrive on time for appointment to keep from having the test rescheduled. If you need to cancel or reschedule your appointment, please call the office within 24 hours of your appointment. Failure to do so may result in a cancellation of your appointment, and a $50 no show fee. Phone number given for call back for any questions.

## 2018-06-13 ENCOUNTER — Other Ambulatory Visit: Payer: Self-pay | Admitting: Cardiovascular Disease

## 2018-06-13 NOTE — Telephone Encounter (Signed)
Patient was seen here by Lyda Jester for an office visit on 06/11/18 and he reported that he does not take clopidogrel. Per documentation from that office visit: He has been compliant with most of his meds, except Plavix. He states he self discontinued this several months ago b/c it was causing dyspnea. Symptoms resolved once he stopped.  I called patient to discuss but didn't get an answer. Will try again later.

## 2018-06-13 NOTE — Telephone Encounter (Signed)
Attempted again to reach patient but was unsuccessful.

## 2018-06-15 ENCOUNTER — Ambulatory Visit (HOSPITAL_COMMUNITY): Payer: 59 | Attending: Cardiovascular Disease

## 2018-06-15 DIAGNOSIS — R079 Chest pain, unspecified: Secondary | ICD-10-CM | POA: Diagnosis not present

## 2018-06-15 DIAGNOSIS — I2510 Atherosclerotic heart disease of native coronary artery without angina pectoris: Secondary | ICD-10-CM | POA: Diagnosis not present

## 2018-06-15 LAB — MYOCARDIAL PERFUSION IMAGING
Estimated workload: 7 METS
Exercise duration (min): 5 min
Exercise duration (sec): 30 s
LV dias vol: 51 mL (ref 62–150)
LV sys vol: 18 mL
MPHR: 163 {beats}/min
Peak HR: 157 {beats}/min
Percent HR: 96 %
Rest HR: 66 {beats}/min
SDS: 5
SRS: 2
SSS: 6
TID: 0.78

## 2018-06-15 MED ORDER — TECHNETIUM TC 99M TETROFOSMIN IV KIT
31.3000 | PACK | Freq: Once | INTRAVENOUS | Status: AC | PRN
  Administered 2018-06-15: 31.3 via INTRAVENOUS
  Filled 2018-06-15: qty 32

## 2018-06-15 MED ORDER — TECHNETIUM TC 99M TETROFOSMIN IV KIT
10.6000 | PACK | Freq: Once | INTRAVENOUS | Status: AC | PRN
  Administered 2018-06-15: 10.6 via INTRAVENOUS
  Filled 2018-06-15: qty 11

## 2018-06-18 ENCOUNTER — Telehealth: Payer: Self-pay

## 2018-06-18 NOTE — Telephone Encounter (Signed)
-----   Message from Consuelo Pandy, [place] sent at 06/15/2018  3:53 PM EST ----- Stress test is normal. No signs of any blood flow abnormalities. Pump function is normal. No further w/u at this time. Continue current heart meds.

## 2018-06-18 NOTE — Telephone Encounter (Signed)
Notes recorded by Frederik Schmidt, RN on 06/18/2018 at 8:30 AM EST The patient has been notified of the result and verbalized understanding. All questions (if any) were answered. Frederik Schmidt, RN 06/18/2018 8:30 AM

## 2018-06-22 ENCOUNTER — Encounter: Payer: Self-pay | Admitting: *Deleted

## 2018-06-22 ENCOUNTER — Other Ambulatory Visit: Payer: Self-pay | Admitting: Cardiovascular Disease

## 2018-06-27 ENCOUNTER — Other Ambulatory Visit: Payer: Self-pay | Admitting: Cardiovascular Disease

## 2018-06-28 ENCOUNTER — Telehealth: Payer: Self-pay

## 2018-06-28 MED ORDER — CLOPIDOGREL BISULFATE 75 MG PO TABS: 75.0000 mg | ORAL_TABLET | Freq: Every day | ORAL | 3 refills | Status: DC

## 2018-06-28 NOTE — Telephone Encounter (Signed)
I spoke to the patient and refilled his Plavix, because he now willing to take it.

## 2018-07-17 ENCOUNTER — Other Ambulatory Visit: Payer: Self-pay | Admitting: Student

## 2018-07-17 DIAGNOSIS — M25572 Pain in left ankle and joints of left foot: Secondary | ICD-10-CM

## 2018-07-22 ENCOUNTER — Ambulatory Visit
Admission: RE | Admit: 2018-07-22 | Discharge: 2018-07-22 | Disposition: A | Discharge status: Home / Self Care | Payer: 59 | Source: Ambulatory Visit | Attending: Student | Admitting: Student

## 2018-07-22 DIAGNOSIS — M7662 Achilles tendinitis, left leg: Secondary | ICD-10-CM | POA: Diagnosis not present

## 2018-07-22 DIAGNOSIS — M25572 Pain in left ankle and joints of left foot: Secondary | ICD-10-CM

## 2018-07-30 DIAGNOSIS — M898X7 Other specified disorders of bone, ankle and foot: Secondary | ICD-10-CM | POA: Diagnosis not present

## 2018-07-30 DIAGNOSIS — M7662 Achilles tendinitis, left leg: Secondary | ICD-10-CM | POA: Diagnosis not present

## 2018-07-30 DIAGNOSIS — M6702 Short Achilles tendon (acquired), left ankle: Secondary | ICD-10-CM | POA: Diagnosis not present

## 2018-08-01 ENCOUNTER — Telehealth: Payer: Self-pay

## 2018-08-01 NOTE — Telephone Encounter (Signed)
OK to hold Plavix for the procedure. Thanks, chris

## 2018-08-01 NOTE — Telephone Encounter (Signed)
   [facility] Pre-operative Risk Assessment    Request for surgical clearance:  1. What type of surgery is being performed? Left gastrocnemius recession, achilles reconstruction  2. When is this surgery scheduled? TBD   3. What type of clearance is required (medical clearance vs. Pharmacy clearance to hold med vs. Both)? Pharmacy  4. Are there any medications that need to be held prior to surgery and how long? Plavix   5. Practice name and name of physician performing surgery? [facility]/Dr. Wylene Simmer   6. What is your office phone number [phone]    7.   What is your office fax number [phone]  8.   Anesthesia type (None, local, MAC, general) ? General   Alexander [name] 08/01/2018, 10:11 AM  _________________________________________________________________   (provider comments below)

## 2018-08-01 NOTE — Telephone Encounter (Signed)
   Primary Cardiologist: Lauree Chandler, MD  Chart reviewed as part of pre-operative protocol coverage. Dr. Angelena Form, can patient hold Plavix for 5 days? Last PCI 09/2016. Low risk stress test 05/2018.   [place], [place] 08/01/2018, 10:42 AM

## 2018-09-14 ENCOUNTER — Other Ambulatory Visit: Payer: Self-pay | Admitting: Physician Assistant

## 2018-09-19 ENCOUNTER — Other Ambulatory Visit: Payer: Self-pay | Admitting: Cardiovascular Disease

## 2018-10-16 DIAGNOSIS — M25775 Osteophyte, left foot: Secondary | ICD-10-CM | POA: Diagnosis not present

## 2018-10-16 DIAGNOSIS — M7662 Achilles tendinitis, left leg: Secondary | ICD-10-CM | POA: Diagnosis not present

## 2018-10-16 DIAGNOSIS — M6702 Short Achilles tendon (acquired), left ankle: Secondary | ICD-10-CM | POA: Diagnosis not present

## 2018-10-29 ENCOUNTER — Other Ambulatory Visit: Payer: Self-pay | Admitting: Cardiovascular Disease

## 2019-06-07 ENCOUNTER — Other Ambulatory Visit: Payer: Self-pay

## 2019-06-07 ENCOUNTER — Telehealth (INDEPENDENT_AMBULATORY_CARE_PROVIDER_SITE_OTHER): Payer: Self-pay | Admitting: Cardiovascular Disease

## 2019-06-07 ENCOUNTER — Telehealth: Payer: Self-pay | Admitting: *Deleted

## 2019-06-07 VITALS — Wt 235.0 lb

## 2019-06-07 DIAGNOSIS — I10 Essential (primary) hypertension: Secondary | ICD-10-CM

## 2019-06-07 DIAGNOSIS — I2510 Atherosclerotic heart disease of native coronary artery without angina pectoris: Secondary | ICD-10-CM

## 2019-06-07 NOTE — Patient Instructions (Signed)

## 2019-06-07 NOTE — Telephone Encounter (Signed)

## 2019-06-07 NOTE — Progress Notes (Signed)
Virtual Visit via Video Note   This visit type was conducted due to national recommendations for restrictions regarding the COVID-19 Pandemic (e.g. social distancing) in an effort to limit this patient's exposure and mitigate transmission in our community.  Due to his co-morbid illnesses, this patient is at least at moderate risk for complications without adequate follow up.  This format is felt to be most appropriate for this patient at this time.  All issues noted in this document were discussed and addressed.  A limited physical exam was performed with this format.  Please refer to the patient's chart for his consent to telehealth for [facility].   Date:  06/07/2019   ID:  Alexander [name], DOB 1961/02/25, MRN [id number]  Patient Location: Home Provider Location: Home  PCP:  Shirline Frees, MD  Cardiologist:  Lauree Chandler, MD  Electrophysiologist:  None   Evaluation Performed:  Follow-Up Visit  Chief Complaint:  Follow up- CAD  History of Present Illness:    Alexander [name] is a 59 y.o. male with history of HTN and CAD who is being seen today by virtual e-visit due to the Covid19 pandemic. He was admitted to [facility] 2018 with a NSTEMI. Cardiac cath May 2018 with a severe stenosis in the diagonal branch treated with balloon angioplasty. He was also found to have 25% ostial LAD stenosis, 50% mid LAD stenosis, 90% distal Circumflex stenosis (very small caliber vessel). LVEF normal by LV gram. Echo 02/20/17 with normal LV size and function, LVEF=60-65%, no valve disease.Nuclear stress test January 2020 with no ischemia.   The patient denies chest pain, dyspnea, palpitations, dizziness, near syncope or syncope. No lower extremity edema.   The patient does not have symptoms concerning for COVID-19 infection (fever, chills, cough, or new shortness of breath).    Past Medical History:  Diagnosis Date  . Arthritis   . Chest pain with moderate risk for cardiac etiology 10/26/2016    . Diverticulosis   . Erosive esophagitis    LA class grade B 2007  . Esophageal stricture   . GERD (gastroesophageal reflux disease)   . Hiatal hernia   . Hypertension   . Sarcoidosis   . 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 COLONOSCOPY    . CORONARY BALLOON ANGIOPLASTY N/A 10/27/2016   Procedure: Coronary Balloon Angioplasty;  Surgeon: Jettie Booze, MD;  Location: [facility];  Service: Cardiovascular;  Laterality: N/A;  . HERNIA REPAIR    . LEFT HEART CATH AND CORONARY ANGIOGRAPHY N/A 10/27/2016   Procedure: Left Heart Cath and Coronary Angiography;  Surgeon: Jettie Booze, MD;  Location: [facility];  Service: Cardiovascular;  Laterality: N/A;  . TONSILLECTOMY AND ADENOIDECTOMY    . UPPER GASTROINTESTINAL ENDOSCOPY       Current Meds  Medication Sig  . aspirin EC 81 MG EC tablet Take 1 tablet (81 mg total) by mouth daily.  Marland Kitchen atorvastatin (LIPITOR) 40 MG tablet Take 1 tablet (40 mg total) by mouth daily at 6 PM.  . clopidogrel (PLAVIX) 75 MG tablet Take 1 tablet (75 mg total) by mouth daily.  Marland Kitchen esomeprazole (NEXIUM) 40 MG capsule Take 40 mg by mouth daily at 12 noon.  . metoprolol tartrate (LOPRESSOR) 25 MG tablet TAKE 1 TABLET BY MOUTH TWICE A DAY  . nitroGLYCERIN (NITROSTAT) 0.4 MG SL tablet Place 1 tablet (0.4 mg total) under the tongue every 5 (  five) minutes as needed for chest pain.     Allergies:   Patient has no known allergies.   Social History   Tobacco Use  . Smoking status: Never Smoker  . Smokeless tobacco: Never Used  Substance Use Topics  . Alcohol use: No  . Drug use: No     Family Hx: The patient's family history includes Heart attack (age of onset: 23) in his maternal aunt; Heart disease in his mother; Leukemia in his maternal grandmother; Stomach cancer (age of onset: 18) in his father. There is no history of Colon cancer, Diabetes, Kidney disease, Liver disease, Colon  polyps, or Rectal cancer.  ROS:   Please see the history of present illness.    All other systems reviewed and are negative.   Prior CV studies:   The following studies were reviewed today:    Labs/Other Tests and Data Reviewed:    EKG:  No ECG reviewed.  Recent Labs: No results found for requested labs within last 8760 hours.   Recent Lipid Panel Lab Results  Component Value Date/Time   CHOL 115 12/21/2016 09:11 AM   TRIG 97 12/21/2016 09:11 AM   HDL 38 (L) 12/21/2016 09:11 AM   CHOLHDL 3.0 12/21/2016 09:11 AM   LDLCALC 58 12/21/2016 09:11 AM    Wt Readings from Last 3 Encounters:  06/07/19 235 lb (106.6 kg)  06/11/18 245 lb 12.8 oz (111.5 kg)  05/12/17 242 lb (109.8 kg)     Objective:    Vital Signs:  Wt 235 lb (106.6 kg)   BMI 29.77 kg/m    VITAL SIGNS:  reviewed GEN:  no acute distress  ASSESSMENT & PLAN:    1.CAD without angina: No chest pain concerning for angina. Continue ASA, Plavix, statin and beta blocker  2. HTN: Pt unable to check BP at home.   3. Hyperlipidemia: Lipids controlled at last check. He has a primary care appt for March and will have labs then. Continue statin  COVID-19 Education: The signs and symptoms of COVID-19 were discussed with the patient and how to seek care for testing (follow up with PCP or arrange E-visit).  The importance of social distancing was discussed today.  Time:   Today, I have spent 16 minutes with the patient with telehealth technology discussing the above problems.     Medication Adjustments/Labs and Tests Ordered: Current medicines are reviewed at length with the patient today.  Concerns regarding medicines are outlined above.   Tests Ordered: No orders of the defined types were placed in this encounter.   Medication Changes: No orders of the defined types were placed in this encounter.   Disposition:  Follow up in 12 month(s)  Signed, Lauree Chandler, MD  06/07/2019 9:40 AM    [place]

## 2019-06-16 ENCOUNTER — Other Ambulatory Visit: Payer: Self-pay | Admitting: Physician Assistant

## 2019-08-02 DIAGNOSIS — Z0000 Encounter for general adult medical examination without abnormal findings: Secondary | ICD-10-CM | POA: Diagnosis not present

## 2019-08-12 ENCOUNTER — Other Ambulatory Visit: Payer: Self-pay | Admitting: Cardiovascular Disease

## 2019-09-16 DIAGNOSIS — L308 Other specified dermatitis: Secondary | ICD-10-CM | POA: Diagnosis not present

## 2019-11-20 ENCOUNTER — Other Ambulatory Visit: Payer: Self-pay | Admitting: Cardiovascular Disease

## 2020-02-27 DIAGNOSIS — D1801 Hemangioma of skin and subcutaneous tissue: Secondary | ICD-10-CM | POA: Diagnosis not present

## 2020-02-27 DIAGNOSIS — D225 Melanocytic nevi of trunk: Secondary | ICD-10-CM | POA: Diagnosis not present

## 2020-02-27 DIAGNOSIS — L814 Other melanin hyperpigmentation: Secondary | ICD-10-CM | POA: Diagnosis not present

## 2020-02-27 DIAGNOSIS — L821 Other seborrheic keratosis: Secondary | ICD-10-CM | POA: Diagnosis not present

## 2020-03-18 DIAGNOSIS — Z23 Encounter for immunization: Secondary | ICD-10-CM | POA: Diagnosis not present

## 2020-05-17 ENCOUNTER — Other Ambulatory Visit: Payer: Self-pay | Admitting: Cardiovascular Disease

## 2020-07-29 ENCOUNTER — Other Ambulatory Visit: Payer: Self-pay | Admitting: Cardiovascular Disease

## 2020-08-14 ENCOUNTER — Other Ambulatory Visit: Payer: Self-pay | Admitting: Cardiovascular Disease

## 2020-08-27 ENCOUNTER — Other Ambulatory Visit: Payer: Self-pay | Admitting: Cardiovascular Disease

## 2020-09-14 ENCOUNTER — Other Ambulatory Visit: Payer: Self-pay | Admitting: Cardiovascular Disease

## 2020-09-26 ENCOUNTER — Other Ambulatory Visit: Payer: Self-pay | Admitting: Cardiovascular Disease

## 2020-10-02 ENCOUNTER — Encounter: Payer: Self-pay | Admitting: Physician Assistant

## 2020-10-02 ENCOUNTER — Ambulatory Visit (INDEPENDENT_AMBULATORY_CARE_PROVIDER_SITE_OTHER): Payer: BC Managed Care – PPO | Admitting: Physician Assistant

## 2020-10-02 ENCOUNTER — Other Ambulatory Visit: Payer: Self-pay

## 2020-10-02 VITALS — BP 122/80 | HR 62 | Ht 74.5 in | Wt 246.0 lb

## 2020-10-02 DIAGNOSIS — I10 Essential (primary) hypertension: Secondary | ICD-10-CM

## 2020-10-02 DIAGNOSIS — E782 Mixed hyperlipidemia: Secondary | ICD-10-CM | POA: Diagnosis not present

## 2020-10-02 DIAGNOSIS — I2510 Atherosclerotic heart disease of native coronary artery without angina pectoris: Secondary | ICD-10-CM

## 2020-10-02 MED ORDER — CLOPIDOGREL BISULFATE 75 MG PO TABS: ORAL_TABLET | ORAL | 3 refills | Status: DC

## 2020-10-02 MED ORDER — METOPROLOL TARTRATE 25 MG PO TABS: 25.0000 mg | ORAL_TABLET | Freq: Two times a day (BID) | ORAL | 3 refills | Status: DC

## 2020-10-02 MED ORDER — ATORVASTATIN CALCIUM 40 MG PO TABS: ORAL_TABLET | ORAL | 3 refills | Status: DC

## 2020-10-02 NOTE — Progress Notes (Addendum)
Cardiology Office Note:    Date:  10/02/2020   ID:  [name], DOB 07-21-1960, MRN [id number]  PCP:  Shirline Frees, MD   [facility] Providers Cardiologist:  Lauree Chandler, MD     Referring MD: Shirline Frees, MD   Chief Complaint:  Follow-up (CAD)    Patient Profile:    Alexander [name] is a 60 y.o. male with:   Coronary artery disease   NSTEMI 5/18 s/p POBA to Dx  Residual dz - mLAD 66, dLCx 59 (Small vessel)>>Med Rx  Myoview 05/2018: low risk  Hypertension   Hyperlipidemia   Prior CV studies: GATED SPECT MYO PERF W/EXERCISE STRESS 1D 06/15/2018 Narrative  Nuclear stress EF: 64%. The left ventricular ejection fraction is normal (55-65%).  T wave inversion was noted during stress in the V4, V5, V6, II and aVF leads. T wave inversion persisted.  The patient walked for 5:30 of a Bruce protocol GXT. Peak HR of 163 which is 96% predicted maximal HR  He has mild ST depression at peak exercise which evolved into TWI during the recovery phase.  This is a low risk study. There is no evidence of ischemia or previous infarction  The study is normal.  Echocardiogram 02/20/2017 Mild LVH, EF 60-65, no RWMA  LEFT HEART CATH AND CORONARY ANGIOGRAPHY 10/27/2016 Narrative  Ost LAD lesion, 25 %stenosed.  Prox to mid LAD lesion, 50 %stenosed, begins just before culprit diagonal lesion.  Dist Cx lesion, 90 %stenosed. This is a very small vessel, not amenable to PCI.  Mild RCA disease.  Mid LAD to Dist LAD lesion, 25 %stenosed.  The left ventricular systolic function is normal.  LV end diastolic pressure is normal.  The left ventricular ejection fraction is 55-65% by visual estimate. Area of anterolateral hypokinesis.  There is no aortic valve stenosis.  Ost 1st Diag to 1st Diag lesion, 99 %stenosed.  Post intervention with a 2.0 balloon, there is a 30% residual stenosis and initial TIMI 2 flow improved to TIMI 3 flow.  History of Present Illness: Mr.  Alexander [name] was last seen by Dr. Angelena Form in 05/2019 via telemedicine.  He returns for follow-up.  He is here alone.  He is doing well.  He has not had chest pain, shortness of breath, syncope, orthopnea, leg edema.  He only walks about 2 times a week.  He does not smoke.         Past Medical History:  Diagnosis Date  . Arthritis   . Chest pain with moderate risk for cardiac etiology 10/26/2016  . Diverticulosis   . Erosive esophagitis    LA class grade B 2007  . Esophageal stricture   . GERD (gastroesophageal reflux disease)   . Hiatal hernia   . Hypertension   . Sarcoidosis   . 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 aspirin EC 81 MG EC tablet Take 1 tablet (81 mg total) by mouth daily.  Marland Kitchen esomeprazole (NEXIUM) 40 MG capsule Take 40 mg by mouth daily at 12 noon.  . nitroGLYCERIN (NITROSTAT) 0.4 MG SL tablet Place 0.4 mg under the tongue every 5 (five) minutes as needed for chest pain.  . [DISCONTINUED] atorvastatin (LIPITOR) 40 MG tablet TAKE 1 TABLET (40 MG TOTAL) BY MOUTH DAILY AT 6 PM. PLEASE SCHEDULE APPOINTMENT FOR FUTURE REFILLS. THANK YOU  . [DISCONTINUED] clopidogrel (PLAVIX) 75 MG tablet Take 1 tablet (75 mg total) by mouth daily. Please make overdue appt  with Dr. Angelena Form before anymore refills. Thank you 3rd attempt  . [DISCONTINUED] metoprolol tartrate (LOPRESSOR) 25 MG tablet TAKE 1 TABLET BY MOUTH TWICE A DAY     Allergies:   Honey bee venom protein [honey bee venom]   Social History   Tobacco Use  . Smoking status: Never Smoker  . Smokeless tobacco: Never Used  Substance Use Topics  . Alcohol use: No  . Drug use: No     Family Hx: The patient's family history includes Heart attack (age of onset: 61) in his maternal aunt; Heart disease in his mother; Leukemia in his maternal grandmother; Stomach cancer (age of onset: 9) in his father. There is no history of Colon cancer, Diabetes, Kidney disease, Liver disease,  Colon polyps, or Rectal cancer.  Review of Systems  Cardiovascular: Negative for claudication.     EKGs/Labs/Other Test Reviewed:    EKG:  EKG is   ordered today.  The ekg ordered today demonstrates normal sinus rhythm, HR 62, normal axis, non-specific ST-TW changes, QTc 401 ms, no changes  Recent Labs: No results found for requested labs within last 8760 hours.   Recent Lipid Panel Lab Results  Component Value Date/Time   CHOL 115 12/21/2016 09:11 AM   TRIG 97 12/21/2016 09:11 AM   HDL 38 (L) 12/21/2016 09:11 AM   CHOLHDL 3.0 12/21/2016 09:11 AM   LDLCALC 58 12/21/2016 09:11 AM      Risk Assessment/Calculations:      Physical Exam:    VS:  BP 122/80   Pulse 62   Ht 6' 2.5" (1.892 m)   Wt 246 lb (111.6 kg)   SpO2 98%   BMI 31.16 kg/m     Wt Readings from Last 3 Encounters:  10/02/20 246 lb (111.6 kg)  06/07/19 235 lb (106.6 kg)  06/11/18 245 lb 12.8 oz (111.5 kg)     Constitutional:      Appearance: Healthy appearance. Not in distress.  Neck:     Vascular: No carotid bruit. JVD normal.  Pulmonary:     Effort: Pulmonary effort is normal.     Breath sounds: No wheezing. No rales.  Cardiovascular:     Normal rate. Regular rhythm. Normal S1. Normal S2.     Murmurs: There is no murmur.  Pulses:    Intact distal pulses.  Edema:    Peripheral edema absent.  Abdominal:     Palpations: Abdomen is soft. There is no hepatomegaly.  Skin:    General: Skin is warm and dry.  Neurological:     General: No focal deficit present.     Mental Status: Alert and oriented to person, place and time.     Cranial Nerves: Cranial nerves are intact.          ASSESSMENT & PLAN:    1. Coronary artery disease involving native coronary artery of native heart without angina pectoris Non-STEMI in 2018 treated with balloon angioplasty to the diagonal.  He has residual disease in the distal LCx which is small and moderate disease in the LAD which is treated medically.  Myoview in  2020 was low risk.  He is doing well without anginal symptoms.  Continue current management with aspirin, atorvastatin, clopidogrel, metoprolol.  We discussed the importance of exercise and weight loss.  I recommended that he walk 30 minutes a day at least 6 days a week.  F/U Dr. Angelena Form in 1 year.  2. Essential hypertension The patient's blood pressure is controlled on his current regimen.  3. Mixed hyperlipidemia He remains on high intensity statin therapy.  He will have labs with his PCP next 1 to 2 months.  We will request those.  Goal LDL < 70.        Dispo:  Return in about 1 year (around 10/02/2021) for Routine follow up in 1 year with Dr.McAlhany. .   Medication Adjustments/Labs and Tests Ordered: Current medicines are reviewed at length with the patient today.  Concerns regarding medicines are outlined above.  Tests Ordered: Orders Placed This Encounter  Procedures  . EKG 12-Lead   Medication Changes: Meds ordered this encounter  Medications  . atorvastatin (LIPITOR) 40 MG tablet    Sig: Take one tablet by mouth ( 40 mg) daily.    Dispense:  90 tablet    Refill:  3    HOLD till pt needs refill.  . metoprolol tartrate (LOPRESSOR) 25 MG tablet    Sig: Take 1 tablet (25 mg total) by mouth 2 (two) times daily.    Dispense:  180 tablet    Refill:  3    HOLD till pt needs refill.  . clopidogrel (PLAVIX) 75 MG tablet    Sig: Take one tablet by mouth ( 75 mg) daily.    Dispense:  90 tablet    Refill:  3    HOLD till pt needs refill.    Signed, Richardson Dopp, PA-C  10/02/2020 12:50 PM    [facility] [address], [place], [place]  [address] Phone: [phone]; Fax: [phone]

## 2020-10-02 NOTE — Patient Instructions (Signed)
Medication Instructions:   Your physician recommends that you continue on your current medications as directed. Please refer to the Current Medication list given to you today.   All your medications were sent in today # 90 with Hold to pharmacy.  *If you need a refill on your cardiac medications before your next appointment, please call your pharmacy*   Lab Work: Will request labs from PCP for June.   If you have labs (blood work) drawn today and your tests are completely normal, you will receive your results only by: Marland Kitchen MyChart Message (if you have MyChart) OR . A paper copy in the mail If you have any lab test that is abnormal or we need to change your treatment, we will call you to review the results.   Testing/Procedures: -None   Follow-Up: At [facility], you and your health needs are our priority.  As part of our continuing mission to provide you with exceptional heart care, we have created designated Provider Care Teams.  These Care Teams include your primary Cardiologist (physician) and Advanced Practice Providers (APPs -  Physician Assistants and Nurse Practitioners) who all work together to provide you with the care you need, when you need it.  We recommend signing up for the patient portal called "MyChart".  Sign up information is provided on this After Visit Summary.  MyChart is used to connect with patients for Virtual Visits (Telemedicine).  Patients are able to view lab/test results, encounter notes, upcoming appointments, etc.  Non-urgent messages can be sent to your provider as well.   To learn more about what you can do with MyChart, go to [email or web address].    Your next appointment:   1 year(s)  The format for your next appointment:   In Person  Provider:   Lauree Chandler, MD   Other Instructions Your physician wants you to follow-up in: 1 year with Dr.McAlhany.  You will receive a reminder letter in the mail two months in advance. If you don't  receive a letter, please call our office to schedule the follow-up appointment.

## 2020-11-04 IMAGING — MR MR ANKLE*L* W/O CM
4 of 6 series · 13 of 40 positions shown · non-contrast
Comparison: Left foot x-rays dated January 21, 2010.

CLINICAL DATA: Lateral ankle pain and swelling since injury doing
weighted calf extensions 3 months ago. Evaluate for Achilles tendon
injury.

EXAM:
MRI OF THE LEFT ANKLE WITHOUT CONTRAST
TECHNIQUE: Multiplanar, multisequence MR imaging of the ankle was performed. No
intravenous contrast was administered.

[Series 3: PD fat-sat · axial · left · 3.0mm · 0.25mm/px · z∈[-83,+49]mm · 4 of 40 slices shown]
[im 1/40]
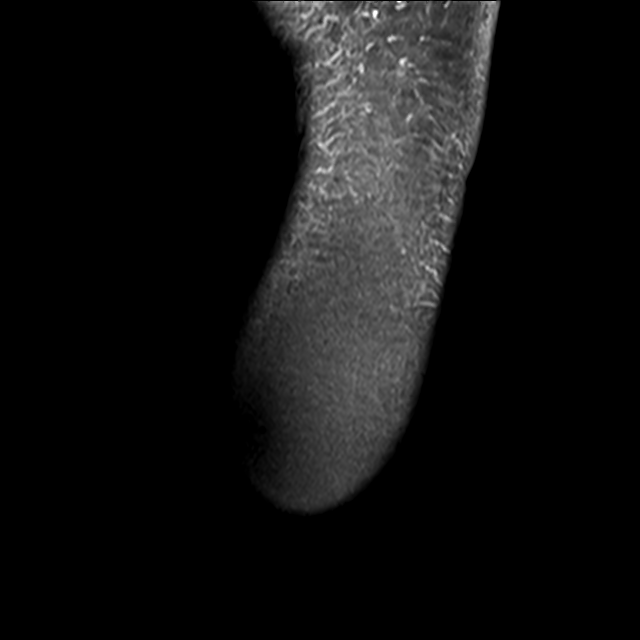
[im 6/40]
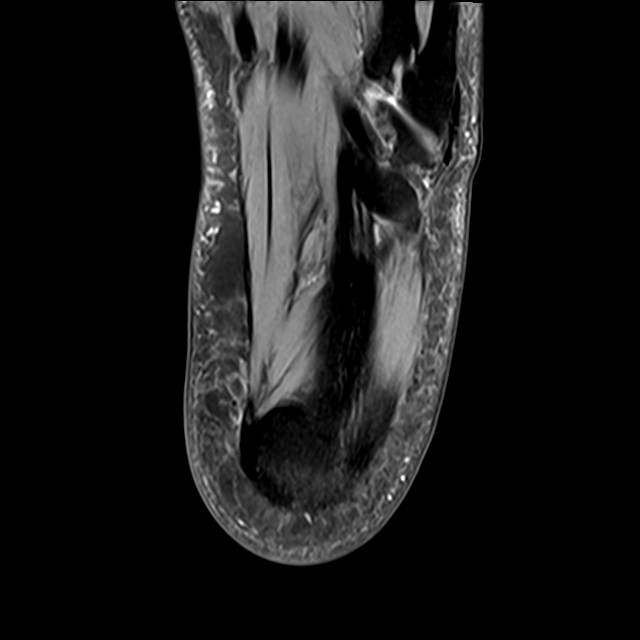
[im 23/40]
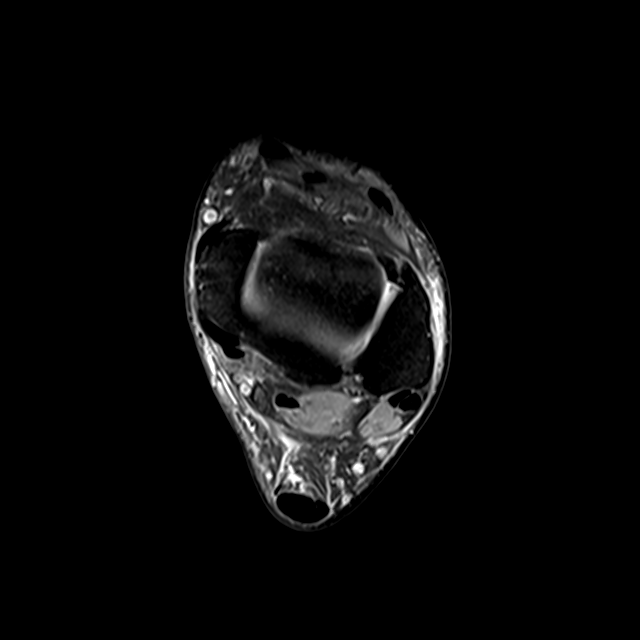
[im 34/40]
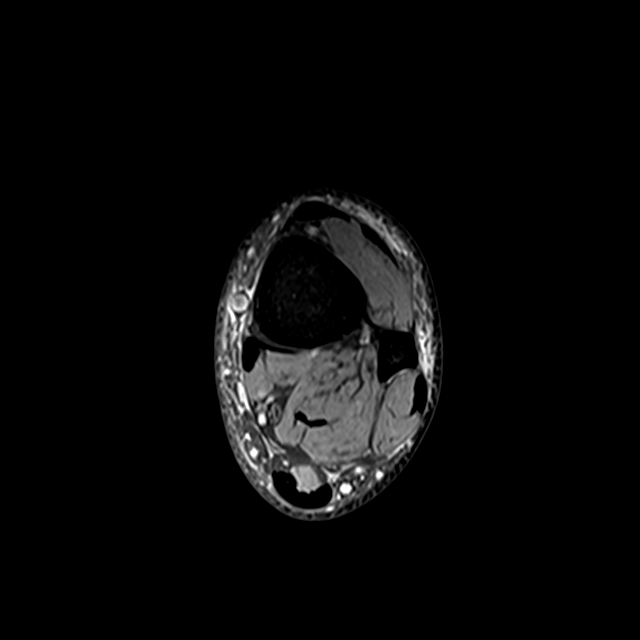

[Series 4: T2 fat-sat · axial · left · 3.0mm · 0.25mm/px · z∈[-63,+49]mm · 3 of 40 slices shown (1 of 2)]
[im 6/40]
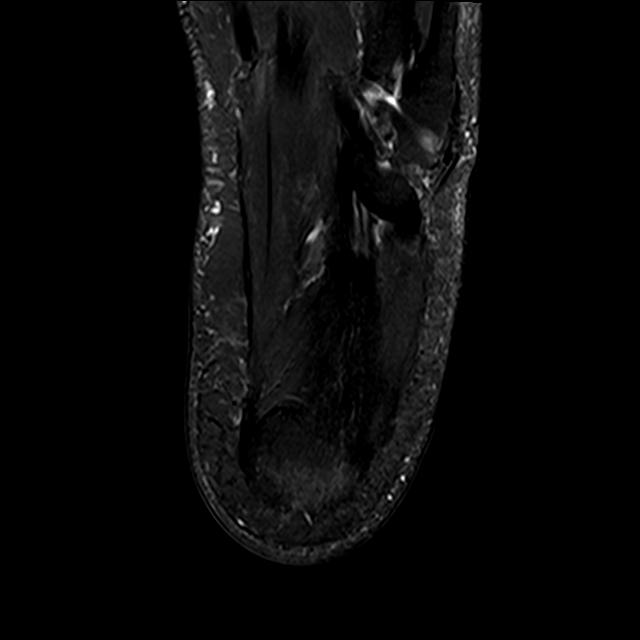
[im 23/40]
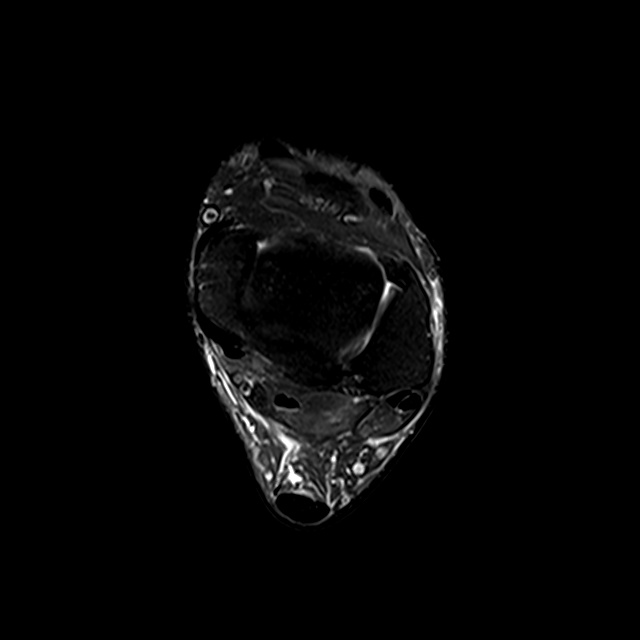
[im 34/40]
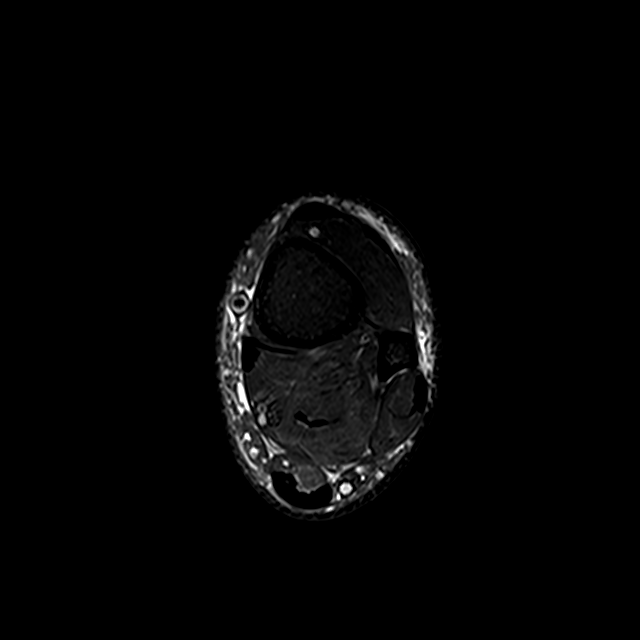

[Series 5: T1 · axial · left · 3.0mm · 0.25mm/px · z∈[-63,+49]mm · 3 of 40 slices shown]
[im 6/40]
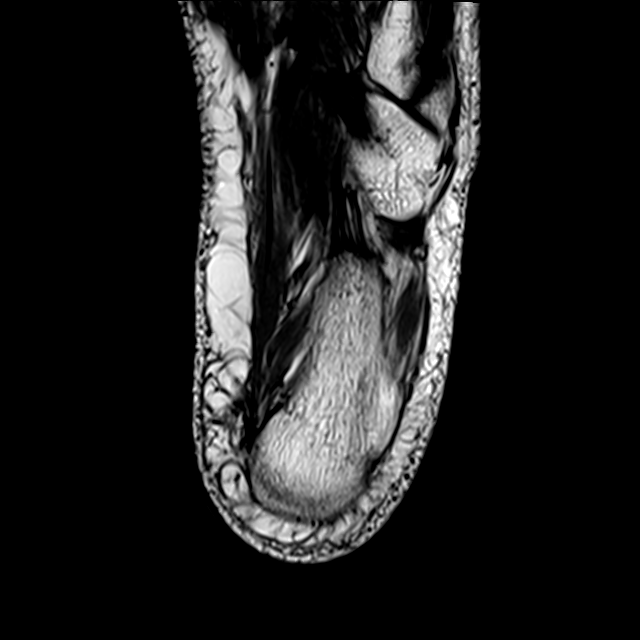
[im 23/40]
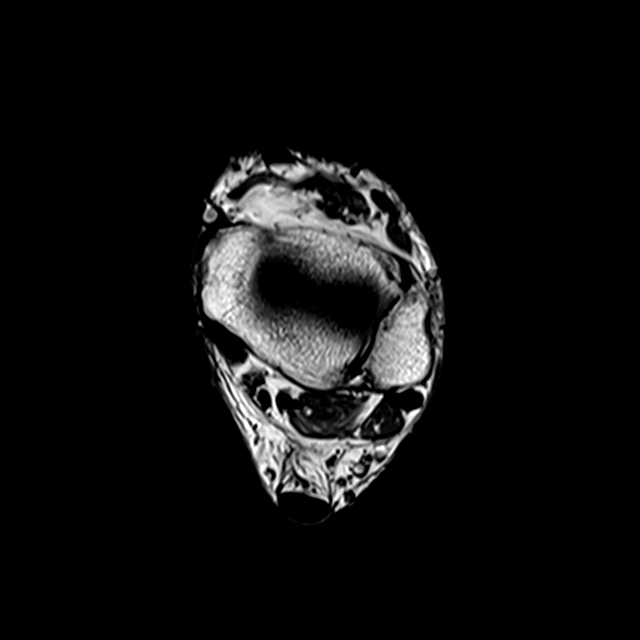
[im 34/40]
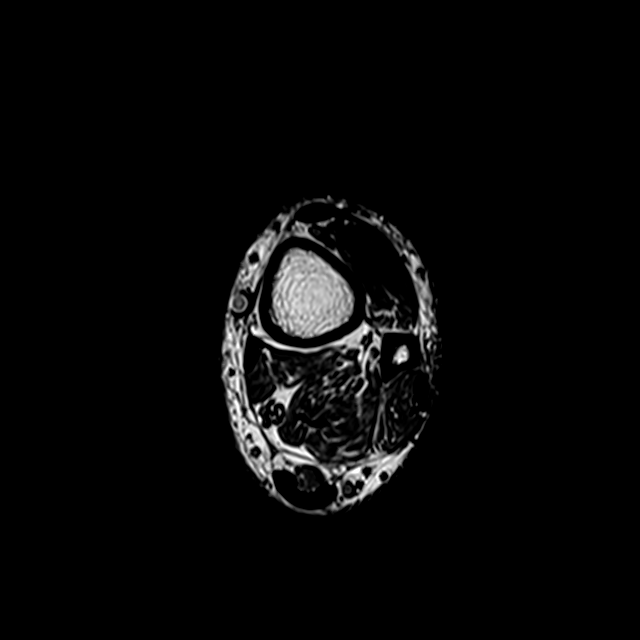

[Series 8: T2 fat-sat · coronal · left · 3.0mm · 0.28mm/px · 3 of 39 slices shown (2 of 2)]
[im 6/39]
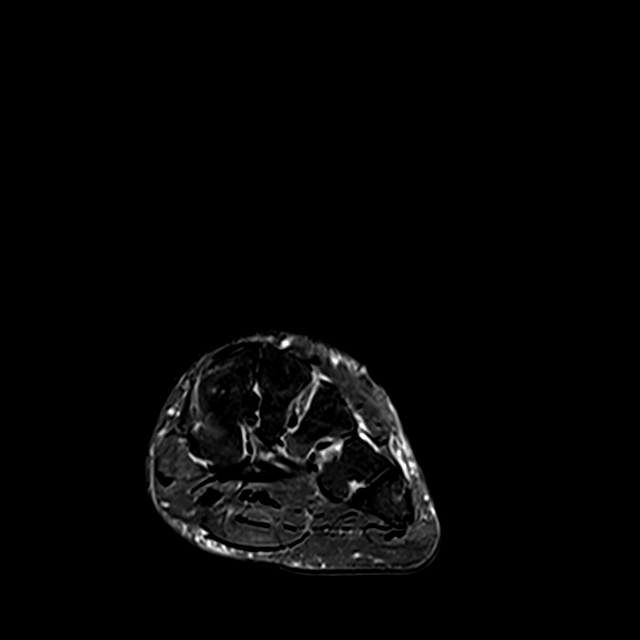
[im 22/39]
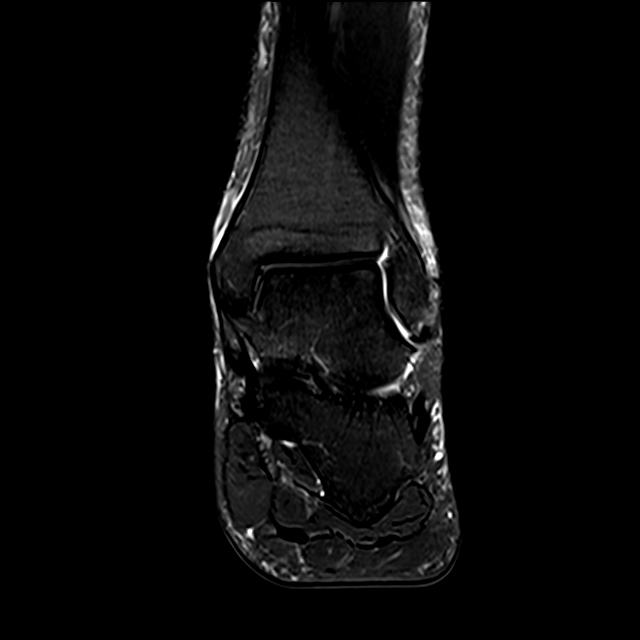
[im 33/39]
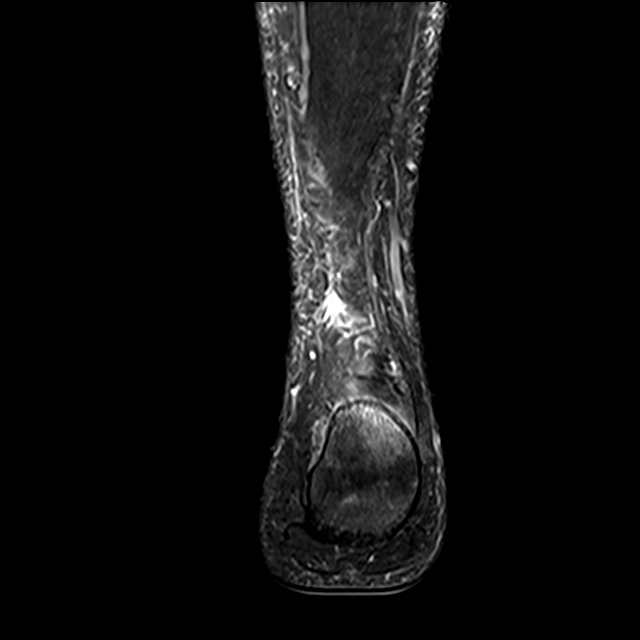

[13 of 40 positions shown; findings below may reference images not displayed]

FINDINGS: TENDONS

Peroneal: Peroneal longus tendon intact. Peroneal brevis intact.

Posteromedial: Posterior tibial tendon intact. Flexor hallucis
longus tendon intact. Flexor digitorum longus tendon intact.

Anterior: Tibialis anterior tendon intact. Extensor hallucis longus
tendon intact Extensor digitorum longus tendon intact.

Achilles: Thickening and increased intermediate signal within the
distal Achilles tendon with small partial tear at the calcaneal
insertion. No high-grade tear or tendon retraction.

Plantar Fascia: Intact.

LIGAMENTS

Lateral: Anterior talofibular ligament intact. Calcaneofibular
ligament intact. Posterior talofibular ligament intact. Anterior and
posterior tibiofibular ligaments intact.

Medial: Deltoid ligament intact. Spring ligament intact.

CARTILAGE

Ankle Joint: Trace joint effusion laterally. Normal ankle mortise.
No chondral defect.

Subtalar Joints/Sinus Tarsi: Normal subtalar joints. No subtalar
joint effusion. Normal sinus tarsi.

Bones: Prominent reactive marrow edema in the calcaneal tuberosity.
No acute fracture or dislocation. No focal bone lesion.

Soft Tissue: Reactive edema within Kager's fat. Small amount of
fluid in the retrocalcaneal bursa. No soft tissue mass or fluid
collection.
IMPRESSION: 1. Moderate distal Achilles tendinosis with small partial tear at
the calcaneal insertion. No high-grade tear or tendon retraction.
2. Reactive edema within the calcaneal tuberosity and Kager's fat
with mild retrocalcaneal bursitis.

## 2020-11-20 DIAGNOSIS — L814 Other melanin hyperpigmentation: Secondary | ICD-10-CM | POA: Diagnosis not present

## 2020-11-20 DIAGNOSIS — D225 Melanocytic nevi of trunk: Secondary | ICD-10-CM | POA: Diagnosis not present

## 2020-11-20 DIAGNOSIS — L821 Other seborrheic keratosis: Secondary | ICD-10-CM | POA: Diagnosis not present

## 2020-12-06 ENCOUNTER — Emergency Department (HOSPITAL_BASED_OUTPATIENT_CLINIC_OR_DEPARTMENT_OTHER)
Admission: EM | Admit: 2020-12-06 | Discharge: 2020-12-06 | Disposition: A | Discharge status: Home / Self Care | Payer: BC Managed Care – PPO | Attending: Emergency Medicine | Admitting: Emergency Medicine

## 2020-12-06 ENCOUNTER — Other Ambulatory Visit: Payer: Self-pay

## 2020-12-06 ENCOUNTER — Encounter (HOSPITAL_BASED_OUTPATIENT_CLINIC_OR_DEPARTMENT_OTHER): Payer: Self-pay | Admitting: *Deleted

## 2020-12-06 DIAGNOSIS — Z7902 Long term (current) use of antithrombotics/antiplatelets: Secondary | ICD-10-CM | POA: Insufficient documentation

## 2020-12-06 DIAGNOSIS — I10 Essential (primary) hypertension: Secondary | ICD-10-CM | POA: Insufficient documentation

## 2020-12-06 DIAGNOSIS — G5701 Lesion of sciatic nerve, right lower limb: Secondary | ICD-10-CM | POA: Diagnosis not present

## 2020-12-06 DIAGNOSIS — Z7982 Long term (current) use of aspirin: Secondary | ICD-10-CM | POA: Diagnosis not present

## 2020-12-06 DIAGNOSIS — M25551 Pain in right hip: Secondary | ICD-10-CM | POA: Diagnosis not present

## 2020-12-06 DIAGNOSIS — Z79899 Other long term (current) drug therapy: Secondary | ICD-10-CM | POA: Insufficient documentation

## 2020-12-06 MED ORDER — DIAZEPAM 5 MG PO TABS
5.0000 mg | ORAL_TABLET | Freq: Once | ORAL | Status: AC
  Administered 2020-12-06: 5 mg via ORAL
  Filled 2020-12-06: qty 1

## 2020-12-06 MED ORDER — METHOCARBAMOL 750 MG PO TABS: 750.0000 mg | ORAL_TABLET | Freq: Four times a day (QID) | ORAL | 0 refills | Status: AC

## 2020-12-06 MED ORDER — KETOROLAC TROMETHAMINE 60 MG/2ML IM SOLN
15.0000 mg | Freq: Once | INTRAMUSCULAR | Status: AC
  Administered 2020-12-06: 15 mg via INTRAMUSCULAR
  Filled 2020-12-06: qty 2

## 2020-12-06 MED ORDER — OXYCODONE HCL 5 MG PO TABS
5.0000 mg | ORAL_TABLET | Freq: Once | ORAL | Status: AC
  Administered 2020-12-06: 5 mg via ORAL
  Filled 2020-12-06: qty 1

## 2020-12-06 MED ORDER — ACETAMINOPHEN 500 MG PO TABS
1000.0000 mg | ORAL_TABLET | Freq: Once | ORAL | Status: AC
  Administered 2020-12-06: 1000 mg via ORAL
  Filled 2020-12-06: qty 2

## 2020-12-06 NOTE — ED Triage Notes (Addendum)
Right hip for a month but is getting worst in the last 3 days.  Difficulty ambulating.  Denies any injuries.

## 2020-12-06 NOTE — Discharge Instructions (Addendum)
Your back pain is most likely due to a muscular strain.  There is been a lot of research on back pain, unfortunately the only thing that seems to really help is Tylenol and ibuprofen.  Relative rest is also important to not lift greater than 10 pounds bending or twisting at the waist.  Please follow-up with your family physician.  The other thing that really seems to benefit patients is physical therapy which your doctor may send you for.  Please return to the emergency department for new numbness or weakness to your arms or legs. Difficulty with urinating or urinating or pooping on yourself.  Also if you cannot feel toilet paper when you wipe or get a fever.   Take 4 over the counter ibuprofen tablets 3 times a day or 2 over-the-counter naproxen tablets twice a day for pain. Also take tylenol 1000mg (2 extra strength) four times a day.   I did prescribe you a muscle relaxant since you are having some spasm.  You can try this at home you can take it up to 4 times a day if you would like.  It can make you a little bit sleepy.  You should not drive a car while taking this medication.

## 2020-12-06 NOTE — ED Notes (Signed)
ED Provider at bedside. 

## 2020-12-06 NOTE — ED Provider Notes (Signed)
[facility] EMERGENCY DEPT Provider Note   CSN: [id number] Arrival date & time: 12/06/20  0900     History Chief Complaint  Patient presents with   Hip Pain    Alexander [name] is a 60 y.o. male.  60 yo M with a chief complaints of right-sided buttock pain.  Going on for about a month off and on but worsening over the past few days.  He thinks that he must of done something 2 days ago to make it worse though does not remember any specific injury.  Denies trauma denies loss of bowel or bladder denies loss of peritoneal sensation denies weakness in the leg.  He reports some feeling of tingling to the right hamstring area.  Is worried that he is having pain in his hip.  Denies any abdominal pain denies fevers denies history of cancer.  Denies recent back surgery or instrumentation or injection to his spine.  The history is provided by the patient and the spouse.  Hip Pain This is a new problem. The current episode started more than 1 week ago. The problem occurs constantly. The problem has been gradually worsening. Pertinent negatives include no chest pain, no abdominal pain, no headaches and no shortness of breath. The symptoms are aggravated by bending, twisting and walking. Nothing relieves the symptoms. He has tried nothing for the symptoms. The treatment provided no relief.      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    Patient Active Problem List   Diagnosis Date Noted   Tendonitis, Achilles, left 05/04/2018   Tendonitis, Achilles, right 05/04/2018   Hyponatremia 10/27/2016   Non-ST elevation (NSTEMI) myocardial infarction [facility])    Hypertension 06/14/2012   Sarcoidosis  06/14/2012   Cough 06/13/2012   ESOPHAGEAL STRICTURE 10/23/2009   REFLUX ESOPHAGITIS 04/27/2009   OTHER DYSPHAGIA 04/27/2009   Personal history of colonic polyps 04/27/2009    Past Surgical History:  Procedure Laterality Date   COLONOSCOPY     CORONARY BALLOON ANGIOPLASTY N/A 10/27/2016   Procedure: Coronary Balloon Angioplasty;  Surgeon: Jettie Booze, MD;  Location: [place];  Service: Cardiovascular;  Laterality: N/A;   HERNIA REPAIR     LEFT HEART CATH AND CORONARY ANGIOGRAPHY N/A 10/27/2016   Procedure: Left Heart Cath and Coronary Angiography;  Surgeon: Jettie Booze, MD;  Location: [place] [facility];  Service: Cardiovascular;  Laterality: N/A;   TONSILLECTOMY AND ADENOIDECTOMY     UPPER GASTROINTESTINAL ENDOSCOPY         Family History  Problem Relation Age of Onset   Heart disease Mother        CABG age 72, born 3   Stomach cancer Father 41   Leukemia Maternal Grandmother    Heart attack Maternal Aunt 35   Colon cancer Neg Hx    Diabetes Neg Hx    Kidney disease Neg Hx    Liver disease Neg Hx    Colon polyps Neg Hx    Rectal cancer Neg Hx     Social History   Tobacco Use   Smoking status: Never   Smokeless tobacco: Never  Substance Use Topics   Alcohol use: No   Drug use: No    Home Medications Prior to Admission medications   Medication Sig Start Date End Date Taking? Authorizing Provider  methocarbamol (ROBAXIN-750) 750 MG tablet Take 1 tablet (750 mg total) by mouth 4 (four) times daily for 2 days. 12/06/20 12/08/20 Yes Deno Etienne, DO  aspirin EC 81 MG EC tablet Take 1 tablet (81 mg total) by mouth daily. 10/29/16   Allie Bossier, MD  atorvastatin (LIPITOR) 40 MG tablet Take one tablet by mouth ( 40 mg) daily. 10/02/20   Richardson Dopp T, PA-C  clopidogrel (PLAVIX) 75 MG tablet Take one tablet by mouth ( 75 mg) daily. 10/02/20   Richardson Dopp T, PA-C  esomeprazole (NEXIUM) 40 MG capsule Take 40 mg by mouth daily at 12 noon.     [provider]  metoprolol tartrate (LOPRESSOR) 25 MG tablet Take 1 tablet (25 mg total) by mouth 2 (two) times daily. 10/02/20   Richardson Dopp T, PA-C  nitroGLYCERIN (NITROSTAT) 0.4 MG SL tablet Place 0.4 mg under the tongue every 5 (five) minutes as needed for chest pain.    [provider]    Allergies    Honey bee venom protein [honey bee venom]  Review of Systems   Review of Systems  Constitutional:  Negative for chills and fever.  HENT:  Negative for congestion and facial swelling.   Eyes:  Negative for discharge and visual disturbance.  Respiratory:  Negative for shortness of breath.   Cardiovascular:  Negative for chest pain and palpitations.  Gastrointestinal:  Negative for abdominal pain, diarrhea and vomiting.  Musculoskeletal:  Positive for arthralgias, back pain and myalgias.  Skin:  Negative for color change and rash.  Neurological:  Negative for tremors, syncope and headaches.  Psychiatric/Behavioral:  Negative for confusion and dysphoric mood.    Physical Exam Updated Vital Signs BP (!) 142/94 (BP Location: Right Arm)   Pulse 64   Temp 98 F (36.7 C) (Oral)   Resp 16   Ht 6\' 2"  (1.88 m)   Wt 108.9 kg   SpO2 100%   BMI 30.81 kg/m   Physical Exam Vitals and nursing note reviewed.  Constitutional:      Appearance: He is well-developed.  HENT:     Head: Normocephalic and atraumatic.  Eyes:     Pupils: Pupils are equal, round, and reactive to light.  Neck:     Vascular: No JVD.  Cardiovascular:     Rate and Rhythm: Normal rate and regular rhythm.     Heart sounds: No murmur heard.   No friction rub. No gallop.  Pulmonary:     Effort: No respiratory distress.     Breath sounds: No wheezing.  Abdominal:     General: There is no distension.     Tenderness: There is no abdominal tenderness. There is no guarding or rebound.  Musculoskeletal:        General: Tenderness present. Normal range of motion.     Cervical back: Normal range of  motion and neck supple.     Comments: No midline spinal tenderness step-offs or deformities.  Patient has pain and spasm to the right piriformis.  Able to range the hip without obvious hip pathology.  Pulse motor and sensation intact reflexes are 2+ and equal no clonus negative Babinski  Skin:    Coloration: Skin is not pale.     Findings: No rash.  Neurological:     Mental  Status: He is alert and oriented to person, place, and time.  Psychiatric:        Behavior: Behavior normal.    ED Results / Procedures / Treatments   Labs (all labs ordered are listed, but only abnormal results are displayed) Labs Reviewed - No data to display  EKG None  Radiology No results found.  Procedures Procedures   Medications Ordered in ED Medications  acetaminophen (TYLENOL) tablet 1,000 mg (1,000 mg Oral Given 12/06/20 0944)  ketorolac (TORADOL) injection 15 mg (15 mg Intramuscular Given 12/06/20 0944)  oxyCODONE (Oxy IR/ROXICODONE) immediate release tablet 5 mg (5 mg Oral Given 12/06/20 0944)  diazepam (VALIUM) tablet 5 mg (5 mg Oral Given 12/06/20 0944)    ED Course  I have reviewed the triage vital signs and the nursing notes.  Pertinent labs & imaging results that were available during my care of the patient were reviewed by me and considered in my medical decision making (see chart for details).    MDM Rules/Calculators/A&P                          60 yo M with a chief complaints of right-sided back pain.  Most likely piriformis syndrome by history and physical.  No red flags.  Benign exam.  With some spasm will treat with muscle relaxants.  Given sports medicine follow-up if needed.  PCP follow-up.  9:50 AM:  I have discussed the diagnosis/risks/treatment options with the patient and family and believe the pt to be eligible for discharge home to follow-up with PCP, sports med. We also discussed returning to the ED immediately if new or worsening sx occur. We discussed the sx which are most  concerning (e.g., sudden worsening pain, fever, inability to tolerate by mouth, cauda equina s/sx) that necessitate immediate return. Medications administered to the patient during their visit and any new prescriptions provided to the patient are listed below.  Medications given during this visit Medications  acetaminophen (TYLENOL) tablet 1,000 mg (1,000 mg Oral Given 12/06/20 0944)  ketorolac (TORADOL) injection 15 mg (15 mg Intramuscular Given 12/06/20 0944)  oxyCODONE (Oxy IR/ROXICODONE) immediate release tablet 5 mg (5 mg Oral Given 12/06/20 0944)  diazepam (VALIUM) tablet 5 mg (5 mg Oral Given 12/06/20 0944)     The patient appears reasonably screen and/or stabilized for discharge and I doubt any other medical condition or other [facility] requiring further screening, evaluation, or treatment in the ED at this time prior to discharge.   Final Clinical Impression(s) / ED Diagnoses Final diagnoses:  Piriformis syndrome of right side    Rx / DC Orders ED Discharge Orders          Ordered    methocarbamol (ROBAXIN-750) 750 MG tablet  4 times daily        12/06/20 [place], [place], DO 12/06/20 [id number]

## 2020-12-06 NOTE — ED Notes (Signed)
Patient Alert and oriented to baseline. Stable and ambulatory to baseline. Patient verbalized understanding of the discharge instructions.  Patient belongings were taken by the patient.   

## 2020-12-15 DIAGNOSIS — M5441 Lumbago with sciatica, right side: Secondary | ICD-10-CM | POA: Diagnosis not present

## 2020-12-21 DIAGNOSIS — Z6830 Body mass index (BMI) 30.0-30.9, adult: Secondary | ICD-10-CM | POA: Diagnosis not present

## 2020-12-21 DIAGNOSIS — M4726 Other spondylosis with radiculopathy, lumbar region: Secondary | ICD-10-CM | POA: Diagnosis not present

## 2020-12-21 DIAGNOSIS — M5136 Other intervertebral disc degeneration, lumbar region: Secondary | ICD-10-CM | POA: Diagnosis not present

## 2020-12-21 DIAGNOSIS — M545 Low back pain, unspecified: Secondary | ICD-10-CM | POA: Diagnosis not present

## 2020-12-21 DIAGNOSIS — I10 Essential (primary) hypertension: Secondary | ICD-10-CM | POA: Diagnosis not present

## 2021-03-18 DIAGNOSIS — Z23 Encounter for immunization: Secondary | ICD-10-CM | POA: Diagnosis not present

## 2021-03-25 DIAGNOSIS — J029 Acute pharyngitis, unspecified: Secondary | ICD-10-CM | POA: Diagnosis not present

## 2021-03-25 DIAGNOSIS — J069 Acute upper respiratory infection, unspecified: Secondary | ICD-10-CM | POA: Diagnosis not present

## 2021-04-01 DIAGNOSIS — K219 Gastro-esophageal reflux disease without esophagitis: Secondary | ICD-10-CM | POA: Diagnosis not present

## 2021-04-01 DIAGNOSIS — Z0000 Encounter for general adult medical examination without abnormal findings: Secondary | ICD-10-CM | POA: Diagnosis not present

## 2021-04-01 DIAGNOSIS — E7800 Pure hypercholesterolemia, unspecified: Secondary | ICD-10-CM | POA: Diagnosis not present

## 2021-04-01 DIAGNOSIS — E291 Testicular hypofunction: Secondary | ICD-10-CM | POA: Diagnosis not present

## 2021-04-01 DIAGNOSIS — Z125 Encounter for screening for malignant neoplasm of prostate: Secondary | ICD-10-CM | POA: Diagnosis not present

## 2021-04-01 DIAGNOSIS — I10 Essential (primary) hypertension: Secondary | ICD-10-CM | POA: Diagnosis not present

## 2021-04-30 DIAGNOSIS — J069 Acute upper respiratory infection, unspecified: Secondary | ICD-10-CM | POA: Diagnosis not present

## 2021-04-30 DIAGNOSIS — Z03818 Encounter for observation for suspected exposure to other biological agents ruled out: Secondary | ICD-10-CM | POA: Diagnosis not present

## 2021-05-12 DIAGNOSIS — D72828 Other elevated white blood cell count: Secondary | ICD-10-CM | POA: Diagnosis not present

## 2021-05-28 DIAGNOSIS — J019 Acute sinusitis, unspecified: Secondary | ICD-10-CM | POA: Diagnosis not present

## 2021-05-28 DIAGNOSIS — D860 Sarcoidosis of lung: Secondary | ICD-10-CM | POA: Diagnosis not present

## 2021-05-28 DIAGNOSIS — R0981 Nasal congestion: Secondary | ICD-10-CM | POA: Diagnosis not present

## 2021-05-28 DIAGNOSIS — R053 Chronic cough: Secondary | ICD-10-CM | POA: Diagnosis not present

## 2021-06-24 ENCOUNTER — Encounter: Payer: Self-pay | Admitting: Gastroenterology

## 2021-08-17 ENCOUNTER — Other Ambulatory Visit: Payer: Self-pay | Admitting: Physician Assistant

## 2021-09-14 ENCOUNTER — Other Ambulatory Visit: Payer: Self-pay | Admitting: Physician Assistant

## 2021-10-19 NOTE — Progress Notes (Unsigned)
No chief complaint on file.  History of Present Illness: 61 yo Alexander [name] with history of HTN, HLD, CAD who is here today for follow up. He was admitted to [facility] May 2018 with a NSTEMI. Cardiac cath May 2018 with a severe stenosis in the diagonal branch treated with balloon angioplasty. He was also found to have 25% ostial LAD stenosis, 50% mid LAD stenosis, 90% distal Circumflex stenosis (very small caliber vessel). LVEF normal by LV gram. He was treated with ASA and Brilinta for one month and then was changed to ASA and Plavix. Echo 02/20/17 with normal LV size and function, LVEF=60-65%, no valve disease. Nuclear stress test January 2020 with no ischemia. He was last seen in our office May 2022 by Alexander Dopp, PA-C and was doing well.   He is here today for follow up. The patient denies any chest pain, dyspnea, palpitations, lower extremity edema, orthopnea, PND, dizziness, near syncope or syncope.   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    Past Surgical History:  Procedure Laterality Date   COLONOSCOPY     CORONARY BALLOON ANGIOPLASTY N/A 10/27/2016   Procedure: Coronary Balloon Angioplasty;  Surgeon: Jettie Booze, MD;  Location: [place] [facility];  Service: Cardiovascular;  Laterality: N/A;   HERNIA REPAIR     LEFT HEART CATH AND CORONARY ANGIOGRAPHY N/A 10/27/2016   Procedure: Left Heart Cath and Coronary Angiography;  Surgeon: Jettie Booze, MD;  Location: [place] [facility];  Service: Cardiovascular;  Laterality: N/A;   TONSILLECTOMY AND ADENOIDECTOMY     UPPER GASTROINTESTINAL ENDOSCOPY      Current Outpatient Medications   Medication Sig Dispense Refill   aspirin EC 81 MG EC tablet Take 1 tablet (81 mg total) by mouth daily. 1 tablet 0   atorvastatin (LIPITOR) 40 MG tablet Take one tablet by mouth ( 40 mg) daily. 90 tablet 3   clopidogrel (PLAVIX) 75 MG tablet Take 1 tablet (75 mg total) by mouth daily. 90 tablet 0   esomeprazole (NEXIUM) 40 MG capsule Take 40 mg by mouth daily at Alexander noon.     metoprolol tartrate (LOPRESSOR) 25 MG tablet Take 1 tablet (25 mg total) by mouth 2 (two) times daily. 180 tablet 3   nitroGLYCERIN (NITROSTAT) 0.4 MG SL tablet Place 0.4 mg under the tongue every 5 (five) minutes as needed for chest pain.     No current facility-administered medications for this visit.    Allergies  Allergen Reactions   Honey Bee Venom Protein [Honey Bee Venom]     Social History   Socioeconomic History   Marital status: Married    Spouse name: Not on file   Number of children: Not on file   Years of education: Not on file   Highest education level: Not on file  Occupational History   Occupation: Psychologist, prison and probation services: [employer]  Tobacco Use   Smoking status: Never   Smokeless tobacco: Never  Substance and Sexual Activity   Alcohol use: No   Drug use: No   Sexual activity: Not on file  Other Topics Concern   Not on file  Social History Narrative   Lives with wife in [place], [place].   Social Determinants of Health   Financial Resource Strain: Not on file  Food Insecurity: Not on file  Transportation Needs: Not on file  Physical Activity: Not on file  Stress: Not on file  Social Connections: Not on file  Intimate Partner Violence: Not on file    Family History  Problem Relation Age of Onset   Heart disease Mother        CABG age 52, born 90   Stomach cancer Father 72   Leukemia Maternal Grandmother    Heart attack Maternal Aunt 35   Colon cancer Neg Hx    Diabetes Neg Hx    Kidney disease Neg Hx    Liver disease Neg Hx    Colon polyps Neg Hx     Rectal cancer Neg Hx     Review of Systems:  As stated in the HPI and otherwise negative.   There were no vitals taken for this visit.  Physical Examination:  General: Well developed, well nourished, NAD  HEENT: OP clear, mucus membranes moist  SKIN: warm, dry. No rashes. Neuro: No focal deficits  Musculoskeletal: Muscle strength 5/5 all ext  Psychiatric: Mood and affect normal  Neck: No JVD, no carotid bruits, no thyromegaly, no lymphadenopathy.  Lungs:Clear bilaterally, no wheezes, rhonci, crackles Cardiovascular: Regular rate and rhythm. No murmurs, gallops or rubs. Abdomen:Soft. Bowel sounds present. Non-tender.  Extremities: No lower extremity edema. Pulses are 2 + in the bilateral DP/PT.  Cardiac cath 10/27/16: Anterior Descending  Ost LAD lesion, 25% stenosed.  Prox LAD lesion, 50% stenosed.  Mid LAD to Dist LAD lesion, 25% stenosed.  First Diagonal Branch  Ost 1st Diag to 1st Diag lesion, 99% stenosed. The lesion is type C and eccentric.  Angioplasty: Lesion crossed with guidewire using a WIRE HI TORQ BMW 190CM. Angioplasty alone was performed using a BALLOON EUPHORA RX 2.0X15. Maximum pressure: 14 atm. The pre-interventional distal flow is decreased (TIMI 2). The post-interventional distal flow is normal (TIMI 3). The intervention was successful . No complications occurred at this lesion. Prowater placed in LAD to protect this vessel.  There is a 30% residual stenosis post intervention.  Ramus Intermedius  Vessel is large.  Left Circumflex  Dist Cx lesion, 90% stenosed.  Right Coronary Artery  Ost RCA lesion, 25% stenosed.  Mid RCA lesion, 25% stenosed.  Dist RCA lesion, 25% stenosed.  Wall Motion   Resting               Left Heart   Left Ventricle The left ventricular size is normal. The left ventricular systolic function is normal. LV end diastolic pressure is normal. The left ventricular ejection fraction is 55-65% by visual estimate. There are LV function  abnormalities due to segmental dysfunction.    Aortic Valve There is no aortic valve stenosis.    Coronary Diagrams   Diagnostic Diagram       Post-Intervention Diagram        Echo 02/20/17: - Left ventricle: The cavity size was normal. Wall thickness was   increased in a pattern of mild LVH. Systolic function was normal.   The estimated ejection fraction was in the range of 60% to 65%.   Wall motion was normal; there were no regional wall motion   abnormalities. Left ventricular diastolic function parameters   were normal.  EKG:  EKG is *** ordered  today. The ekg ordered today demonstrates   Recent Labs: No results found for requested labs within last 8760 hours.   Lipid Panel    Component Value Date/Time   CHOL 115 12/21/2016 0911   TRIG 97 12/21/2016 0911   HDL 38 (L) 12/21/2016 0911   CHOLHDL 3.0 12/21/2016 0911   LDLCALC 58 12/21/2016 0911     Wt Readings from Last 3 Encounters:  12/06/20 240 lb (108.9 kg)  10/02/20 246 lb (111.6 kg)  06/07/19 235 lb (106.6 kg)     Other studies Reviewed: Additional studies/ records that were reviewed today include: . Review of the above records demonstrates:   Assessment and Plan:   1. CAD without angina: No chest pain. Continue ASA, Plavix, statin and beta blocker.   2. HTN: BP is well controlled. No changes  3. Hyperlipidemia: LDL ***. Continue statin   Current medicines are reviewed at length with the patient today.  The patient does not have concerns regarding medicines.  The following changes have been made:  no change  Labs/ tests ordered today include:   No orders of the defined types were placed in this encounter.    Disposition:   FU with me in Alexander months   Signed, Lauree Chandler, MD 10/19/2021 1:00 PM    [address], [place], [place]  [address] Phone: [phone]; Fax: [phone]

## 2021-10-20 ENCOUNTER — Encounter: Payer: Self-pay | Admitting: Cardiovascular Disease

## 2021-10-20 ENCOUNTER — Ambulatory Visit (INDEPENDENT_AMBULATORY_CARE_PROVIDER_SITE_OTHER): Payer: BC Managed Care – PPO | Admitting: Cardiovascular Disease

## 2021-10-20 VITALS — BP 122/64 | HR 57 | Ht 72.0 in | Wt 243.4 lb

## 2021-10-20 DIAGNOSIS — I2510 Atherosclerotic heart disease of native coronary artery without angina pectoris: Secondary | ICD-10-CM | POA: Diagnosis not present

## 2021-10-20 DIAGNOSIS — E782 Mixed hyperlipidemia: Secondary | ICD-10-CM | POA: Diagnosis not present

## 2021-10-20 DIAGNOSIS — I10 Essential (primary) hypertension: Secondary | ICD-10-CM

## 2021-10-20 LAB — HEPATIC FUNCTION PANEL
ALT: 21 IU/L (ref 0–44)
AST: 13 IU/L (ref 0–40)
Albumin: 4.3 g/dL (ref 3.8–4.9)
Alkaline Phosphatase: 129 IU/L — ABNORMAL HIGH (ref 44–121)
Bilirubin Total: 0.8 mg/dL (ref 0.0–1.2)
Bilirubin, Direct: 0.19 mg/dL (ref 0.00–0.40)
Total Protein: 7 g/dL (ref 6.0–8.5)

## 2021-10-20 LAB — LIPID PANEL
Chol/HDL Ratio: 3.3 ratio (ref 0.0–5.0)
Cholesterol, Total: 130 mg/dL (ref 100–199)
HDL: 39 mg/dL — ABNORMAL LOW (ref >39)
LDL Chol Calc (NIH): 74 mg/dL (ref 0–99)
Triglycerides: 88 mg/dL (ref 0–149)
VLDL Cholesterol Cal: 17 mg/dL (ref 5–40)

## 2021-10-20 NOTE — Patient Instructions (Signed)
Medication Instructions:  No changes *If you need a refill on your cardiac medications before your next appointment, please call your pharmacy*   Lab Work: (Lipids/Liver)  If you have labs (blood work) drawn today and your tests are completely normal, you will receive your results only by: [place] (if you have MyChart) OR A paper copy in the mail If you have any lab test that is abnormal or we need to change your treatment, we will call you to review the results.   Testing/Procedures: none   Follow-Up: At [facility], you and your health needs are our priority.  As part of our continuing mission to provide you with exceptional heart care, we have created designated Provider Care Teams.  These Care Teams include your primary Cardiologist (physician) and Advanced Practice Providers (APPs -  Physician Assistants and Nurse Practitioners) who all work together to provide you with the care you need, when you need it.  We recommend signing up for the patient portal called "MyChart".  Sign up information is provided on this After Visit Summary.  MyChart is used to connect with patients for Virtual Visits (Telemedicine).  Patients are able to view lab/test results, encounter notes, upcoming appointments, etc.  Non-urgent messages can be sent to your provider as well.   To learn more about what you can do with MyChart, go to [email or web address].    Your next appointment:   12 month(s)  The format for your next appointment:   In Person  Provider:   Lauree Chandler, MD     Important Information About Sugar

## 2021-11-17 ENCOUNTER — Other Ambulatory Visit: Payer: Self-pay | Admitting: Physician Assistant

## 2021-12-08 ENCOUNTER — Encounter: Payer: Self-pay | Admitting: Gastroenterology

## 2021-12-15 ENCOUNTER — Other Ambulatory Visit: Payer: Self-pay | Admitting: Cardiovascular Disease

## 2022-01-04 DIAGNOSIS — R519 Headache, unspecified: Secondary | ICD-10-CM | POA: Diagnosis not present

## 2022-02-09 ENCOUNTER — Ambulatory Visit (INDEPENDENT_AMBULATORY_CARE_PROVIDER_SITE_OTHER): Payer: BC Managed Care – PPO | Admitting: Gastroenterology

## 2022-02-09 ENCOUNTER — Encounter: Payer: Self-pay | Admitting: Gastroenterology

## 2022-02-09 ENCOUNTER — Telehealth: Payer: Self-pay

## 2022-02-09 VITALS — BP 124/80 | HR 74 | Ht 74.5 in | Wt 242.2 lb

## 2022-02-09 DIAGNOSIS — Z86010 Personal history of colonic polyps: Secondary | ICD-10-CM | POA: Diagnosis not present

## 2022-02-09 DIAGNOSIS — Z7902 Long term (current) use of antithrombotics/antiplatelets: Secondary | ICD-10-CM

## 2022-02-09 MED ORDER — NA SULFATE-K SULFATE-MG SULF 17.5-3.13-1.6 GM/177ML PO SOLN: 1.0000 | Freq: Once | ORAL | 0 refills | Status: AC

## 2022-02-09 NOTE — Patient Instructions (Signed)
You have been scheduled for a colonoscopy. Please follow written instructions given to you at your visit today.  Please pick up your prep supplies at the pharmacy within the next 1-3 days. If you use inhalers (even only as needed), please bring them with you on the day of your procedure.  The [facility] providers would like to encourage you to use MYCHART to communicate with providers for non-urgent requests or questions.  Due to long hold times on the telephone, sending your provider a message by MYCHART may be a faster and more efficient way to get a response.  Please allow 48 business hours for a response.  Please remember that this is for non-urgent requests.   Due to recent changes in healthcare laws, you may see the results of your imaging and laboratory studies on MyChart before your provider has had a chance to review them.  We understand that in some cases there may be results that are confusing or concerning to you. Not all laboratory results come back in the same time frame and the provider may be waiting for multiple results in order to interpret others.  Please give us 48 hours in order for your provider to thoroughly review all the results before contacting the office for clarification of your results.   Thank you for choosing me and [facility].  Malcolm T. Stark, Jr., MD., FACG  

## 2022-02-09 NOTE — Telephone Encounter (Signed)
[facility] Pre-operative Risk Assessment     Request for surgical clearance:     Endoscopy Procedure  What type of surgery is being performed?     colonoscopy  When is this surgery scheduled?     03/28/22   What type of clearance is required ?   Pharmacy  Are there any medications that need to be held prior to surgery and how long? Plavix x 5 days  Practice name and name of physician performing surgery?      [facility]  What is your office phone and fax number?      Phone- [phone]  [phone]  Anesthesia type (None, local, MAC, general) ?       MAC

## 2022-02-09 NOTE — Progress Notes (Signed)
Assessment    Personal history of SSA, remote history of TVA GERD, history of an esophageal stricture  CAD, S/P NSTEMI in 2018 on Plavix  Recommendations   Schedule colonoscopy. The risks (including bleeding, perforation, infection, missed lesions, medication reactions and possible hospitalization or surgery if complications occur), benefits, and alternatives to colonoscopy with possible biopsy and possible polypectomy were discussed with the patient and they consent to proceed.   Hold Plavix 5 days before procedure - will instruct when and how to resume after procedure. Low but real risk of cardiovascular event such as heart attack, stroke, embolism, thrombosis or ischemia/infarct of other organs off Plavix explained and need to seek urgent help if this occurs. The patient consents to proceed. Will communicate by phone or EMR with patient's prescribing provider to confirm that holding Plavix is reasonable in this case.   Continue Nexium 40 mg p.o. daily, follow antireflux measures, elevate head of bed 4 inches and Pepcid AC at bedtime as needed.   HPI   Chief complaint: Personal history of SSA and TVA  Patient profile:  Alexander [name] is a 61 y.o. 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 He is maintained on Plavix.  He relates nocturnal reflux if he eats after 7 PM and with some dietary stressors. Denies weight loss, abdominal pain, constipation, diarrhea, change in stool caliber, melena, hematochezia, nausea, vomiting, dysphagia, chest pain.    Previous Labs / Imaging::    Latest Ref Rng & Units 10/28/2016    3:05 AM 10/27/2016    3:38 AM 10/25/2016   10:21 PM  CBC  WBC 4.0 - 10.5 K/uL 5.5  6.6  7.1   Hemoglobin 13.0 - 17.0 g/dL 14.9  15.1  15.5   Hematocrit 39.0 - 52.0 % 43.3  44.6  45.0   Platelets 150 - 400 K/uL 210  216  249     No results found for: "LIPASE"    Latest Ref Rng & Units 10/20/2021     8:49 AM 12/30/2016    8:59 AM 12/21/2016    9:11 AM  CMP  Total Protein 6.0 - 8.5 g/dL 7.0  6.2  6.3   Total Bilirubin 0.0 - 1.2 mg/dL 0.8  0.7  0.8   Alkaline Phos 44 - 121 IU/L 129  131  135   AST 0 - 40 IU/L '13  22  26   '$ ALT 0 - 44 IU/L 21  47  50      Previous GI evaluation    Endoscopies:  Colonoscopy Jan 2018 - One 6 mm polyp in the transverse colon, removed with a cold snare. Resected and retrieved. - One 4 mm polyp in the sigmoid colon, removed with a cold biopsy forceps. Resected and retrieved. - Moderate diverticulosis in the sigmoid colon, in the descending colon and in the transverse colon. There was no evidence of diverticular bleeding. - Internal hemorrhoids. - The examination was otherwise normal on direct and retroflexion views.   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  Past Surgical History:  Procedure Laterality Date   COLONOSCOPY     CORONARY BALLOON ANGIOPLASTY N/A 10/27/2016   Procedure: Coronary Balloon Angioplasty;  Surgeon: Jettie Booze, MD;  Location: [place] [facility];  Service: Cardiovascular;  Laterality: N/A;   HERNIA REPAIR     LEFT HEART CATH AND CORONARY ANGIOGRAPHY N/A 10/27/2016   Procedure: Left Heart Cath and Coronary Angiography;  Surgeon: Jettie Booze, MD;  Location: [place] [facility];  Service: Cardiovascular;  Laterality: N/A;   TONSILLECTOMY AND ADENOIDECTOMY     UPPER GASTROINTESTINAL ENDOSCOPY     Family History  Problem Relation Age of Onset   Heart disease Mother        CABG age 26, born 48   Stomach cancer Father 54   Leukemia Maternal Grandmother    Heart attack Maternal Aunt 35       x3    Colon cancer Neg Hx    Diabetes Neg Hx    Kidney disease Neg Hx    Liver disease Neg Hx    Colon polyps Neg Hx    Rectal cancer Neg Hx    Social History   Tobacco Use   Smoking status: Never   Smokeless tobacco: Never  Vaping Use   Vaping Use: Never used  Substance Use Topics   Alcohol use: No   Drug use: No   Current Outpatient Medications  Medication Sig Dispense Refill   aspirin EC 81 MG EC tablet Take 1 tablet (81 mg total) by mouth daily. 1 tablet 0   atorvastatin (LIPITOR) 40 MG tablet TAKE ONE TABLET BY MOUTH ( 40 MG) DAILY. 90 tablet 3   clopidogrel (PLAVIX) 75 MG tablet TAKE 1 TABLET BY MOUTH EVERY DAY 90 tablet 3   esomeprazole (NEXIUM) 40 MG capsule Take 40 mg by mouth daily at 12 noon.     metoprolol tartrate (LOPRESSOR) 25 MG tablet Take 1 tablet (25 mg total) by mouth 2 (two) times daily. 180 tablet 3   nitroGLYCERIN (NITROSTAT) 0.4 MG SL tablet Place 0.4 mg under the tongue every 5 (five) minutes as needed for chest pain.     No current facility-administered medications for this visit.   Allergies  Allergen Reactions   Honey Bee Venom Protein [Honey Bee Venom]     Review of Systems: All other systems reviewed and negative except where noted in HPI.    Physical Exam    Wt Readings from Last 3 Encounters:  02/09/22 242 lb 3.2 oz (109.9 kg)  10/20/21 243 lb 6.4 oz (110.4 kg)  12/06/20 240 lb (108.9 kg)    BP 124/80   Pulse 74   Ht 6' 2.5" (1.892 m)   Wt 242 lb 3.2 oz (109.9 kg)   SpO2 96%   BMI 30.68 kg/m  Constitutional:  Generally well appearing male in no acute distress. Psychiatric: Pleasant. Normal mood and affect. Behavior is normal. HEENT: Pupils normal.  Conjunctivae are normal. No scleral icterus. Neck supple.  Cardiovascular: Normal rate, regular rhythm. No edema Pulmonary/chest: Effort normal and breath sounds normal. No wheezing, rales or rhonchi. Abdominal: Soft, nondistended, nontender. Bowel sounds active throughout. There are no  masses palpable. No hepatomegaly. Rectal: Deferred to colonoscopy  Neurological: Alert and oriented to person place and time. Skin: Skin is warm and dry. No rashes noted.  Lucio Edward, MD   cc:  Referring Provider Shirline Frees, MD

## 2022-02-11 NOTE — Telephone Encounter (Signed)
Primary Cardiologist:Christopher Angelena Form, MD   Preoperative team, please contact this patient and set up a phone call appointment for early to mid October for further preoperative risk assessment. Please obtain consent and complete medication review. Thank you for your help.   It is reasonable to hold Plavix for 5 days prior to procedure pending no symptoms of ACS.  Emmaline Life, NP-C     02/11/2022, 10:53 AM [address], [address] Office [phone] Fax [phone]

## 2022-02-14 NOTE — Telephone Encounter (Signed)
Left message to call back for a tele pre op appt 

## 2022-02-15 NOTE — Telephone Encounter (Signed)
Left message x 2 to call back for tele pre op appt.  

## 2022-02-16 ENCOUNTER — Telehealth: Payer: Self-pay | Admitting: *Deleted

## 2022-02-16 NOTE — Telephone Encounter (Signed)
Pt agreeable to plan of care for tele pre op appt 03/14/22 @ 9:20.   Med rec and consent are done.

## 2022-02-16 NOTE — Telephone Encounter (Signed)
Pt agreeable to plan of care for tele pre op appt 03/14/22 @ 9:20.   Med rec and consent are done.     Patient Consent for Virtual Visit        Alexander [name] has provided verbal consent on 02/16/2022 for a virtual visit (video or telephone).   CONSENT FOR VIRTUAL VISIT FOR:  Alexander [name]  By participating in this virtual visit I agree to the following:  I hereby voluntarily request, consent and authorize [facility] and its employed or contracted physicians, physician assistants, nurse practitioners or other licensed health care professionals (the Practitioner), to provide me with telemedicine health care services (the "Services") as deemed necessary by the treating Practitioner. I acknowledge and consent to receive the Services by the Practitioner via telemedicine. I understand that the telemedicine visit will involve communicating with the Practitioner through live audiovisual communication technology and the disclosure of certain medical information by electronic transmission. I acknowledge that I have been given the opportunity to request an in-person assessment or other available alternative prior to the telemedicine visit and am voluntarily participating in the telemedicine visit.  I understand that I have the right to withhold or withdraw my consent to the use of telemedicine in the course of my care at any time, without affecting my right to future care or treatment, and that the Practitioner or I may terminate the telemedicine visit at any time. I understand that I have the right to inspect all information obtained and/or recorded in the course of the telemedicine visit and may receive copies of available information for a reasonable fee.  I understand that some of the potential risks of receiving the Services via telemedicine include:  Delay or interruption in medical evaluation due to technological equipment failure or disruption; Information transmitted may not be sufficient  (e.g. poor resolution of images) to allow for appropriate medical decision making by the Practitioner; and/or  In rare instances, security protocols could fail, causing a breach of personal health information.  Furthermore, I acknowledge that it is my responsibility to provide information about my medical history, conditions and care that is complete and accurate to the best of my ability. I acknowledge that Practitioner's advice, recommendations, and/or decision may be based on factors not within their control, such as incomplete or inaccurate data provided by me or distortions of diagnostic images or specimens that may result from electronic transmissions. I understand that the practice of medicine is not an exact science and that Practitioner makes no warranties or guarantees regarding treatment outcomes. I acknowledge that a copy of this consent can be made available to me via my patient portal ([facility]), or I can request a printed copy by calling the office of [facility].    I understand that my insurance will be billed for this visit.   I have read or had this consent read to me. I understand the contents of this consent, which adequately explains the benefits and risks of the Services being provided via telemedicine.  I have been provided ample opportunity to ask questions regarding this consent and the Services and have had my questions answered to my satisfaction. I give my informed consent for the services to be provided through the use of telemedicine in my medical care

## 2022-03-14 ENCOUNTER — Ambulatory Visit: Payer: BC Managed Care – PPO | Attending: Cardiology | Admitting: Nurse Practitioner

## 2022-03-14 DIAGNOSIS — Z01810 Encounter for preprocedural cardiovascular examination: Secondary | ICD-10-CM

## 2022-03-14 NOTE — Progress Notes (Signed)
Virtual Visit via Telephone Note   Because of Alexander [name]'s co-morbid illnesses, he is at least at moderate risk for complications without adequate follow up.  This format is felt to be most appropriate for this patient at this time.  The patient did not have access to video technology/had technical difficulties with video requiring transitioning to audio format only (telephone).  All issues noted in this document were discussed and addressed.  No physical exam could be performed with this format.  Please refer to the patient's chart for his consent to telehealth for Alexander [name].  Evaluation Performed:  Preoperative cardiovascular risk assessment _____________   Date:  03/14/2022   Patient ID:  Alexander [name], DOB [birth year], MRN [id number] Patient Location:  Home Provider location:   Office  Primary Care Provider:  Shirline Frees, [name] Primary Cardiologist:  Alexander Chandler, [name]  Chief Complaint / Patient Profile   61 y.o. y/o male with a h/o CAD s/p PTCA-D1 in 2018, hypertension, hyperlipidemia, GERD, esophageal stricture, and diverticulosis who is pending colonoscopy on 03/28/2022 with [facility] gastroenterology and presents today for telephonic preoperative cardiovascular risk assessment.  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 [facility];  Service: Cardiovascular;  Laterality: N/A;   TONSILLECTOMY AND ADENOIDECTOMY     UPPER GASTROINTESTINAL ENDOSCOPY      Allergies  Allergies  Allergen Reactions   Honey Bee Venom Protein [Honey Bee Venom]     History of Present Illness    Alexander [name] is a 61 y.o. male who presents via audio/video conferencing for a telehealth visit today.  Pt was last seen in cardiology clinic on 10/20/2021 by Alexander [name].  At that time Alexander [name] was doing well.  The patient is now pending procedure as outlined above. Since his last visit, he has done well from a cardiac standpoint. He denies chest pain, palpitations, dyspnea, pnd, orthopnea, n, v, dizziness, syncope, edema, weight gain, or early satiety. All other systems reviewed and are otherwise negative except as noted above.   Home Medications    Prior to Admission medications   Medication Sig Start Date End Date Taking? Authorizing Provider  aspirin EC 81 MG EC tablet Take 1 tablet (81 mg total) by mouth daily. 10/29/16   Alexander Bossier, [name]  atorvastatin (LIPITOR) 40 MG tablet TAKE ONE TABLET BY MOUTH ( 40 MG) DAILY. 11/17/21   Alexander Blanks, [name]  clopidogrel (PLAVIX) 75 MG tablet TAKE 1 TABLET BY MOUTH EVERY DAY 12/15/21   Alexander Blanks, [name]  esomeprazole (NEXIUM) 40 MG capsule Take 40 mg by mouth daily at 12 noon.  Provider, Historical, [name]  metoprolol tartrate (LOPRESSOR) 25 MG tablet Take 1 tablet (25 mg total) by mouth 2 (two) times daily. 10/02/20   Alexander Dopp T, PA-C  nitroGLYCERIN (NITROSTAT) 0.4 MG SL tablet Place 0.4 mg under the tongue every 5 (five) minutes as needed for chest pain.    Provider, Historical, [name]    Physical Exam    Vital Signs:  [name] does not have vital signs available for review today.  Given telephonic nature of communication, physical exam  is limited. AAOx3. NAD. Normal affect.  Speech and respirations are unlabored.  Accessory Clinical Findings    None  Assessment & Plan    1.  Preoperative Cardiovascular Risk Assessment:  According to the Revised Cardiac Risk Index (RCRI), his Perioperative Risk of Major Cardiac Event is (%): 0.9. His Functional Capacity in METs is: 8.97 according to the Duke Activity Status Index (DASI). Therefore, based on ACC/AHA guidelines, patient would be at acceptable risk for the planned procedure without further cardiovascular testing.   The patient was advised that if he develops new symptoms prior to surgery to contact our office to arrange for a follow-up visit, and he verbalized understanding.  Per office protocol, patient may hold Plavix for 5 days prior to procedure. Please resume Plavix as soon as possible postprocedure, at the discretion of the surgeon.  A copy of this note will be routed to requesting surgeon.  Time:   Today, I have spent 3 minutes with the patient with telehealth technology discussing medical history, symptoms, and management plan.     Lenna Sciara, NP  03/14/2022, 9:27 AM

## 2022-03-14 NOTE — Telephone Encounter (Signed)
See telephone pre-op cardiac clearance note from 03/14/22. Patient was informed he can hold Plavix 5 days prior to his procedure.

## 2022-03-17 DIAGNOSIS — Z23 Encounter for immunization: Secondary | ICD-10-CM | POA: Diagnosis not present

## 2022-03-21 ENCOUNTER — Encounter: Payer: Self-pay | Admitting: Gastroenterology

## 2022-03-28 ENCOUNTER — Encounter: Payer: Self-pay | Admitting: Gastroenterology

## 2022-03-28 ENCOUNTER — Ambulatory Visit (AMBULATORY_SURGERY_CENTER): Payer: BC Managed Care – PPO | Admitting: Gastroenterology

## 2022-03-28 VITALS — BP 110/68 | HR 56 | Temp 97.1°F | Resp 17 | Ht 74.0 in | Wt 242.0 lb

## 2022-03-28 DIAGNOSIS — Z09 Encounter for follow-up examination after completed treatment for conditions other than malignant neoplasm: Secondary | ICD-10-CM | POA: Diagnosis not present

## 2022-03-28 DIAGNOSIS — D125 Benign neoplasm of sigmoid colon: Secondary | ICD-10-CM | POA: Diagnosis not present

## 2022-03-28 DIAGNOSIS — D124 Benign neoplasm of descending colon: Secondary | ICD-10-CM

## 2022-03-28 DIAGNOSIS — Z1211 Encounter for screening for malignant neoplasm of colon: Secondary | ICD-10-CM | POA: Diagnosis not present

## 2022-03-28 DIAGNOSIS — Z86010 Personal history of colonic polyps: Secondary | ICD-10-CM | POA: Diagnosis not present

## 2022-03-28 DIAGNOSIS — D123 Benign neoplasm of transverse colon: Secondary | ICD-10-CM | POA: Diagnosis not present

## 2022-03-28 MED ORDER — SODIUM CHLORIDE 0.9 % IV SOLN: 500.0000 mL | Freq: Once | INTRAVENOUS | Status: DC

## 2022-03-28 NOTE — Progress Notes (Signed)
Pt's states no medical or surgical changes since previsit or office visit. VS assessed by D.T 

## 2022-03-28 NOTE — Op Note (Signed)
[place] Patient Name: Alexander [name] Procedure Date: 03/28/2022 9:38 AM MRN: [id number] Endoscopist: Ladene Artist , MD, [id number] Age: 61 Referring MD:  Date of Birth: 03-Jan-1961 Gender: Male Account #: [id number] Procedure:                Colonoscopy Indications:              High risk colon cancer surveillance: Personal                            history of sessile serrated colon polyp (less than                            10 mm in size) with no dysplasia and remote history                            of a tubulovillous adenoma. Medicines:                Monitored Anesthesia Care Procedure:                Pre-Anesthesia Assessment:                           - Prior to the procedure, a History and Physical                            was performed, and patient medications and                            allergies were reviewed. The patient's tolerance of                            previous anesthesia was also reviewed. The risks                            and benefits of the procedure and the sedation                            options and risks were discussed with the patient.                            All questions were answered, and informed consent                            was obtained. Prior Anticoagulants: The patient has                            taken Plavix (clopidogrel), last dose was 8 days                            prior to procedure. ASA Grade Assessment: III - A                            patient with severe systemic disease. After  reviewing the risks and benefits, the patient was                            deemed in satisfactory condition to undergo the                            procedure.                           After obtaining informed consent, the colonoscope                            was passed under direct vision. Throughout the                            procedure, the patient's blood pressure, pulse, and                             oxygen saturations were monitored continuously. The                            Olympus CF-HQ190L [id number]) Colonoscope was                            introduced through the anus and advanced to the the                            cecum, identified by appendiceal orifice and                            ileocecal valve. The ileocecal valve, appendiceal                            orifice, and rectum were photographed. The quality                            of the bowel preparation was adequate. The                            colonoscopy was performed without difficulty. The                            patient tolerated the procedure well. Scope In: 9:53:02 AM Scope Out: 10:06:23 AM Scope Withdrawal Time: 0 hours 11 minutes 30 seconds  Total Procedure Duration: 0 hours 13 minutes 21 seconds  Findings:                 The perianal and digital rectal examinations were                            normal.                           Three sessile polyps were found in the sigmoid  colon, descending colon and transverse colon. The                            polyps were 5 to 8 mm in size. These polyps were                            removed with a cold snare. Resection and retrieval                            were complete.                           Multiple medium-mouthed diverticula were found in                            the right colon. There was no evidence of                            diverticular bleeding.                           Multiple medium-mouthed diverticula were found in                            the left colon. There was evidence of diverticular                            spasm. There was no evidence of diverticular                            bleeding.                           Internal hemorrhoids were found during                            retroflexion. The hemorrhoids were small and Grade                            I (internal  hemorrhoids that do not prolapse).                           The exam was otherwise without abnormality on                            direct and retroflexion views. Complications:            No immediate complications. Estimated blood loss:                            None. Estimated Blood Loss:     Estimated blood loss: none. Impression:               - Three 5 to 8 mm polyps in the sigmoid colon, in  the descending colon and in the transverse colon,                            removed with a cold snare. Resected and retrieved.                           - Moderate diverticulosis in the right colon.                           - Moderate diverticulosis in the left colon.                           - Internal hemorrhoids.                           - The examination was otherwise normal on direct                            and retroflexion views. Recommendation:           - Repeat colonoscopy after studies are complete for                            surveillance based on pathology results.                           - Resume Plavix (clopidogrel) in 2 days at prior                            dose. Refer to managing physician for further                            adjustment of therapy.                           - Patient has a contact number available for                            emergencies. The signs and symptoms of potential                            delayed complications were discussed with the                            patient. Return to normal activities tomorrow.                            Written discharge instructions were provided to the                            patient.                           - High fiber diet long term.                           -  Continue present medications.                           - Await pathology results. Ladene Artist, MD 03/28/2022 10:10:36 AM This report has been signed electronically.

## 2022-03-28 NOTE — Progress Notes (Signed)
History & Physical  Primary Care Physician:  Shirline Frees, MD Primary Gastroenterologist: Lucio Edward, MD  CHIEF COMPLAINT: Personal history of colon polyps   HPI: Alexander [name] is a 61 y.o. male with a personal history of sessile serrated adenoma and a remote history of a tubulovillous adenoma for surveillance colonoscopy.  He is maintained on Plavix which was held for 5 days prior to today's procedure.   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    Past Surgical History:  Procedure Laterality Date   COLONOSCOPY     CORONARY BALLOON ANGIOPLASTY N/A 10/27/2016   Procedure: Coronary Balloon Angioplasty;  Surgeon: Jettie Booze, MD;  Location: [place] [facility];  Service: Cardiovascular;  Laterality: N/A;   HERNIA REPAIR     LEFT HEART CATH AND CORONARY ANGIOGRAPHY N/A 10/27/2016   Procedure: Left Heart Cath and Coronary Angiography;  Surgeon: Jettie Booze, MD;  Location: [place] [facility];  Service: Cardiovascular;  Laterality: N/A;   TONSILLECTOMY AND ADENOIDECTOMY     UPPER GASTROINTESTINAL ENDOSCOPY      Prior to Admission medications   Medication Sig Start Date End Date Taking? Authorizing Provider  atorvastatin (LIPITOR) 40 MG tablet TAKE ONE TABLET BY MOUTH ( 40 MG) DAILY. 11/17/21  Yes Burnell Blanks, MD  esomeprazole (NEXIUM) 40 MG capsule Take 40 mg by mouth daily at 12 noon.   Yes [provider]  metoprolol tartrate (LOPRESSOR) 25 MG tablet Take 1 tablet (25 mg total) by mouth 2 (two) times daily. 10/02/20  Yes Richardson Dopp T, PA-C  aspirin EC 81 MG EC tablet Take 1 tablet (81 mg total) by mouth daily. 10/29/16   Allie Bossier, MD   clopidogrel (PLAVIX) 75 MG tablet TAKE 1 TABLET BY MOUTH EVERY DAY 12/15/21   Burnell Blanks, MD  nitroGLYCERIN (NITROSTAT) 0.4 MG SL tablet Place 0.4 mg under the tongue every 5 (five) minutes as needed for chest pain. Patient not taking: Reported on 03/28/2022    [provider]    Current Outpatient Medications  Medication Sig Dispense Refill   atorvastatin (LIPITOR) 40 MG tablet TAKE ONE TABLET BY MOUTH ( 40 MG) DAILY. 90 tablet 3   esomeprazole (NEXIUM) 40 MG capsule Take 40 mg by mouth daily at 12 noon.     metoprolol tartrate (LOPRESSOR) 25 MG tablet Take 1 tablet (25 mg total) by mouth 2 (two) times daily. 180 tablet 3   aspirin EC 81 MG EC tablet Take 1 tablet (81 mg total) by mouth daily. 1 tablet 0   clopidogrel (PLAVIX) 75 MG tablet TAKE 1 TABLET BY MOUTH EVERY DAY 90 tablet 3   nitroGLYCERIN (NITROSTAT) 0.4 MG SL tablet Place 0.4 mg under the tongue every 5 (five) minutes as needed for chest pain. (Patient not taking: Reported on 03/28/2022)     Current Facility-Administered Medications  Medication Dose Route Frequency Provider Last Rate Last Admin   0.9 %  sodium chloride infusion  500 mL Intravenous Once Ladene Artist, MD        Allergies as of 03/28/2022 - Review Complete 03/28/2022  Allergen Reaction Noted  Honey bee venom protein [honey bee venom]  10/02/2020    Family History  Problem Relation Age of Onset   Heart disease Mother        CABG age 32, born 69   Stomach cancer Father 29   Leukemia Maternal Grandmother    Heart attack Maternal Aunt 35       x3   Colon cancer Neg Hx    Diabetes Neg Hx    Kidney disease Neg Hx    Liver disease Neg Hx    Colon polyps Neg Hx    Rectal cancer Neg Hx     Social History   Socioeconomic History   Marital status: Married    Spouse name: Not on file   Number of children: 3   Years of education: Not on file   Highest education level: Not on file  Occupational History   Occupation: Air cabin crew: [employer]  Tobacco Use   Smoking status: Never   Smokeless tobacco: Never  Vaping Use   Vaping Use: Never used  Substance and Sexual Activity   Alcohol use: No   Drug use: No   Sexual activity: Not on file  Other Topics Concern   Not on file  Social History Narrative   Lives with wife in [place], near [place].   Social Determinants of Health   Financial Resource Strain: Not on file  Food Insecurity: Not on file  Transportation Needs: Not on file  Physical Activity: Not on file  Stress: Not on file  Social Connections: Not on file  Intimate Partner Violence: Not on file    Review of Systems:  All systems reviewed were negative except where noted in HPI.   Physical Exam: General:  Alert, well-developed, in NAD Head:  Normocephalic and atraumatic. Eyes:  Sclera clear, no icterus.   Conjunctiva pink. Ears:  Normal auditory acuity. Mouth:  No deformity or lesions.  Neck:  Supple; no masses . Lungs:  Clear throughout to auscultation.   No wheezes, crackles, or rhonchi. No acute distress. Heart:  Regular rate and rhythm; no murmurs. Abdomen:  Soft, nondistended, nontender. No masses, hepatomegaly. No obvious masses.  Normal bowel .    Rectal:  Deferred   Msk:  Symmetrical without gross deformities.. Pulses:  Normal pulses noted. Extremities:  Without edema. Neurologic:  Alert and  oriented x4;  grossly normal neurologically. Skin:  Intact without significant lesions or rashes. Cervical Nodes:  No significant cervical adenopathy. Psych:  Alert and cooperative. Normal mood and affect.  Impression / Plan:   Personal history of sessile serrated adenoma and a remote history of a tubulovillous adenoma for surveillance colonoscopy.  Plavix held 5 days prior to today's procedure.  Pricilla Riffle. Fuller Plan  03/28/2022, 9:33 AM See Shea Evans, [facility], to contact our on call provider

## 2022-03-28 NOTE — Progress Notes (Signed)
Called to room to assist during endoscopic procedure.  Patient ID and intended procedure confirmed with present staff. Received instructions for my participation in the procedure from the performing physician.  

## 2022-03-28 NOTE — Progress Notes (Signed)
8718 [id number] 3672 initial scope time. Scope not functioning as expected. New scope obtained. Procedure restarted.

## 2022-03-28 NOTE — Progress Notes (Signed)
Pt resting comfortably. VSS. Airway intact. SBAR complete to RN. All questions answered.   

## 2022-03-28 NOTE — Patient Instructions (Signed)
Handouts on hemorrhoids, polyps, high-fiber diet, and diverticulosis given to patient.  Await pathology results. Continue present medications. Resume Plavix (clopidogrel) in 2 days at prior dose on 03/30/22. Refer to managing physician for further adjustment of therapy. Repeat colonoscopy for surveillance will be determined based off of pathology results.   YOU HAD AN ENDOSCOPIC PROCEDURE TODAY AT [facility]:   Refer to the procedure report that was given to you for any specific questions about what was found during the examination.  If the procedure report does not answer your questions, please call your gastroenterologist to clarify.  If you requested that your care partner not be given the details of your procedure findings, then the procedure report has been included in a sealed envelope for you to review at your convenience later.  YOU SHOULD EXPECT: Some feelings of bloating in the abdomen. Passage of more gas than usual.  Walking can help get rid of the air that was put into your GI tract during the procedure and reduce the bloating. If you had a lower endoscopy (such as a colonoscopy or flexible sigmoidoscopy) you may notice spotting of blood in your stool or on the toilet paper. If you underwent a bowel prep for your procedure, you may not have a normal bowel movement for a few days.  Please Note:  You might notice some irritation and congestion in your nose or some drainage.  This is from the oxygen used during your procedure.  There is no need for concern and it should clear up in a day or so.  SYMPTOMS TO REPORT IMMEDIATELY:  Following lower endoscopy (colonoscopy or flexible sigmoidoscopy):  Excessive amounts of blood in the stool  Significant tenderness or worsening of abdominal pains  Swelling of the abdomen that is new, acute  Fever of 100F or higher  For urgent or emergent issues, a gastroenterologist can be reached at any hour by calling [phone]. Do  not use MyChart messaging for urgent concerns.    DIET:  We do recommend a small meal at first, but then you may proceed to your regular diet.  Drink plenty of fluids but you should avoid alcoholic beverages for 24 hours.  ACTIVITY:  You should plan to take it easy for the rest of today and you should NOT DRIVE or use heavy machinery until tomorrow (because of the sedation medicines used during the test).    FOLLOW UP: Our staff will call the number listed on your records the next business day following your procedure.  We will call around 7:15- 8:00 am to check on you and address any questions or concerns that you may have regarding the information given to you following your procedure. If we do not reach you, we will leave a message.     If any biopsies were taken you will be contacted by phone or by letter within the next 1-3 weeks.  Please call us at [phone] if you have not heard about the biopsies in 3 weeks.    SIGNATURES/CONFIDENTIALITY: You and/or your care partner have signed paperwork which will be entered into your electronic medical record.  These signatures attest to the fact that that the information above on your After Visit Summary has been reviewed and is understood.  Full responsibility of the confidentiality of this discharge information lies with you and/or your care-partner.

## 2022-03-29 ENCOUNTER — Telehealth: Payer: Self-pay

## 2022-03-29 NOTE — Telephone Encounter (Signed)
  Follow up Call-     03/28/2022    9:17 AM  Call back number  Post procedure Call Back phone  # [phone]  Permission to leave phone message Yes     Patient questions:  Do you have a fever, pain , or abdominal swelling? No. Pain Score  0 *  Have you tolerated food without any problems? Yes.    Have you been able to return to your normal activities? Yes.    Do you have any questions about your discharge instructions: Diet   No. Medications  No. Follow up visit  No.  Do you have questions or concerns about your Care? No.  Actions: * If pain score is 4 or above: No action needed, pain <4.

## 2022-04-07 ENCOUNTER — Encounter: Payer: Self-pay | Admitting: Gastroenterology

## 2022-05-05 DIAGNOSIS — M25562 Pain in left knee: Secondary | ICD-10-CM | POA: Diagnosis not present

## 2022-05-17 DIAGNOSIS — M25562 Pain in left knee: Secondary | ICD-10-CM | POA: Diagnosis not present

## 2022-05-25 DIAGNOSIS — M7632 Iliotibial band syndrome, left leg: Secondary | ICD-10-CM | POA: Diagnosis not present

## 2022-07-07 DIAGNOSIS — Z23 Encounter for immunization: Secondary | ICD-10-CM | POA: Diagnosis not present

## 2022-09-29 DIAGNOSIS — K1120 Sialoadenitis, unspecified: Secondary | ICD-10-CM | POA: Diagnosis not present

## 2022-10-18 DIAGNOSIS — Z23 Encounter for immunization: Secondary | ICD-10-CM | POA: Diagnosis not present

## 2022-10-20 NOTE — Progress Notes (Signed)
Cardiology Office Note:    Date:  10/26/2022   ID:  JAYANT BERTEAU, DOB Jun 23, 1960, MRN [id number]  PCP:  Johny Blamer, MD  [facility] Providers Cardiologist:  Verne Carrow, MD     Referring MD: Johny Blamer, MD   Chief Complaint:  Follow-up     History of Present Illness:   Alexander [name] is a 62 y.o. male with  history of HTN, HLD, CAD who is here today for follow up. He was admitted to [facility] May 2018 with a NSTEMI. Cardiac cath May 2018 with a severe stenosis in the diagonal branch treated with balloon angioplasty. He was also found to have 25% ostial LAD stenosis, 50% mid LAD stenosis, 90% distal Circumflex stenosis (very small caliber vessel). LVEF normal by LV gram. He was treated with ASA and Brilinta for one month and then was changed to ASA and Plavix. Echo 02/20/17 with normal LV size and function, LVEF=60-65%, no valve disease. Nuclear stress test January 2020 with no ischemia.   He was last seen in our office 09/2021 by Dr. Clifton James and doing well.   Patient comes in for yearly f/u. Complains of DOE going up hill-chronic and not new. No chest pain. Having body aches after he does any amount of work and wondering if it's from his meds. It's been going on for a long time. Walks 2 miles 2-3 days/week, swims in pool during the summer. Golfs on occasion.       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   clopidogrel (PLAVIX) 75 MG tablet TAKE 1 TABLET BY MOUTH EVERY DAY   esomeprazole (NEXIUM) 40 MG capsule Take 40 mg by mouth daily at 12  noon.   nitroGLYCERIN (NITROSTAT) 0.4 MG SL tablet Place 0.4 mg under the tongue every 5 (five) minutes as needed for chest pain.   rosuvastatin (CRESTOR) 20 MG tablet Take 1 tablet (20 mg total) by mouth daily.   [DISCONTINUED] atorvastatin (LIPITOR) 40 MG tablet TAKE ONE TABLET BY MOUTH ( 40 MG) DAILY.   [DISCONTINUED] metoprolol tartrate (LOPRESSOR) 25 MG tablet Take 1 tablet (25 mg total) by mouth 2 (two) times daily.    Allergies:   Honey bee venom protein [honey bee venom]   Social History   Tobacco Use   Smoking status: Never   Smokeless tobacco: Never  Vaping Use   Vaping Use: Never used  Substance Use Topics   Alcohol use: No   Drug use: No    Family Hx: The patient's family history includes Heart attack (age of onset: 52) in his maternal aunt; Heart disease in his mother; Leukemia in his maternal grandmother; Stomach cancer (age of onset: 83) in his father. There is no history of Colon cancer, Diabetes, Kidney disease, Liver disease, Colon polyps, or Rectal cancer.  ROS     Physical Exam:    VS:  BP 118/80   Pulse (!) 55   Ht 6' 2.5" (1.892 m)   Wt 248 lb (  112.5 kg)   SpO2 94%   BMI 31.42 kg/m     Wt Readings from Last 3 Encounters:  10/26/22 248 lb (112.5 kg)  03/28/22 242 lb (109.8 kg)  02/09/22 242 lb 3.2 oz (109.9 kg)    Physical Exam  GEN: Obese, in no acute distress  Neck: no JVD, carotid bruits, or masses Cardiac:RRR; no murmurs, rubs, or gallops  Respiratory:  clear to auscultation bilaterally, normal work of breathing GI: soft, nontender, nondistended, + BS Ext: without cyanosis, clubbing, or edema, Good distal pulses bilaterally Neuro:  Alert and Oriented x 3,  Psych: euthymic mood, full affect        EKGs/Labs/Other Test Reviewed:    EKG:  EKG is   ordered today.  The ekg ordered today demonstrates sinus bradycardia 55/m nonspecific ST changes unchanged prior tracings.  Recent Labs: No results found for requested labs within last 365  days.   Recent Lipid Panel No results for input(s): "CHOL", "TRIG", "HDL", "VLDL", "LDLCALC", "LDLDIRECT" in the last 8760 hours.    Prior CV Studies:   Cardiac cath 10/27/16: Anterior Descending  Ost LAD lesion, 25% stenosed.  Prox LAD lesion, 50% stenosed.  Mid LAD to Dist LAD lesion, 25% stenosed.  First Diagonal Branch  Ost 1st Diag to 1st Diag lesion, 99% stenosed. The lesion is type C and eccentric.  Angioplasty: Lesion crossed with guidewire using a WIRE HI TORQ BMW 190CM. Angioplasty alone was performed using a BALLOON EUPHORA RX 2.0X15. Maximum pressure: 14 atm. The pre-interventional distal flow is decreased (TIMI 2). The post-interventional distal flow is normal (TIMI 3). The intervention was successful . No complications occurred at this lesion. Prowater placed in LAD to protect this vessel.  There is a 30% residual stenosis post intervention.  Ramus Intermedius  Vessel is large.  Left Circumflex  Dist Cx lesion, 90% stenosed.  Right Coronary Artery  Ost RCA lesion, 25% stenosed.  Mid RCA lesion, 25% stenosed.  Dist RCA lesion, 25% stenosed.  Wall Motion         Resting                   Left Heart    Left Ventricle The left ventricular size is normal. The left ventricular systolic function is normal. LV end diastolic pressure is normal. The left ventricular ejection fraction is 55-65% by visual estimate. There are LV function abnormalities due to segmental dysfunction.    Aortic Valve There is no aortic valve stenosis.    Coronary Diagrams    Diagnostic Diagram        Post-Intervention Diagram          Echo 02/20/17: - Left ventricle: The cavity size was normal. Wall thickness was   increased in a pattern of mild LVH. Systolic function was normal.   The estimated ejection fraction was in the range of 60% to 65%.   Wall motion was normal; there were no regional wall motion   abnormalities. Left ventricular diastolic function parameters   were  normal.     Risk Assessment/Calculations/Metrics:              ASSESSMENT & PLAN:   No problem-specific Assessment & Plan notes found for this encounter.   CAD without angina: No chest pain. Chronic DOE unchanged. Continue ASA, Plavix, statin and beta blocker. Increase exercise 150 min weekly. Heart healthy diet.     HTN: BP is well controlled. No changes   Hyperlipidemia: LDL 74 in 09/2021. Having diffuse  body aches after little activity. Will stop lipitor for 2-3 weeks. Then start crestor 20 mg daily.   Will repeat lipids and LFTs 3 months.   Obese-increase exercise and heart healthy diet.             Dispo:  No follow-ups on file.   Medication Adjustments/Labs and Tests Ordered: Current medicines are reviewed at length with the patient today.  Concerns regarding medicines are outlined above.  Tests Ordered: Orders Placed This Encounter  Procedures   Lipid panel   CBC   Basic metabolic panel   EKG 12-Lead   Medication Changes: Meds ordered this encounter  Medications   rosuvastatin (CRESTOR) 20 MG tablet    Sig: Take 1 tablet (20 mg total) by mouth daily.    Dispense:  90 tablet    Refill:  3   metoprolol tartrate (LOPRESSOR) 25 MG tablet    Sig: Take 1 tablet (25 mg total) by mouth 2 (two) times daily.    Dispense:  180 tablet    Refill:  3    HOLD till pt needs refill.   Elson Clan, PA-C  10/26/2022 11:53 AM    [facility] [address] [place], [place], [place]  [address] Phone: [phone]; Fax: [phone]

## 2022-10-26 ENCOUNTER — Ambulatory Visit: Payer: BC Managed Care – PPO | Attending: Physician Assistant | Admitting: Physician Assistant

## 2022-10-26 ENCOUNTER — Encounter: Payer: Self-pay | Admitting: Physician Assistant

## 2022-10-26 VITALS — BP 118/80 | HR 55 | Ht 74.5 in | Wt 248.0 lb

## 2022-10-26 DIAGNOSIS — E782 Mixed hyperlipidemia: Secondary | ICD-10-CM | POA: Diagnosis not present

## 2022-10-26 DIAGNOSIS — I2510 Atherosclerotic heart disease of native coronary artery without angina pectoris: Secondary | ICD-10-CM

## 2022-10-26 DIAGNOSIS — I10 Essential (primary) hypertension: Secondary | ICD-10-CM | POA: Diagnosis not present

## 2022-10-26 DIAGNOSIS — E6609 Other obesity due to excess calories: Secondary | ICD-10-CM

## 2022-10-26 DIAGNOSIS — Z6831 Body mass index (BMI) 31.0-31.9, adult: Secondary | ICD-10-CM

## 2022-10-26 MED ORDER — METOPROLOL TARTRATE 25 MG PO TABS: 25.0000 mg | ORAL_TABLET | Freq: Two times a day (BID) | ORAL | 3 refills | Status: DC

## 2022-10-26 MED ORDER — ROSUVASTATIN CALCIUM 20 MG PO TABS: 20.0000 mg | ORAL_TABLET | Freq: Every day | ORAL | 3 refills | Status: DC

## 2022-10-26 NOTE — Patient Instructions (Addendum)
Medication Instructions:  Your physician has recommended you make the following change in your medication:   Stop taking Lipitor Start taking Crestor 2-3 weeks *If you need a refill on your cardiac medications before your next appointment, please call your pharmacy*   Lab Work: CMET, CBC, Lipids  If you have labs (blood work) drawn today and your tests are completely normal, you will receive your results only by: MyChart Message (if you have MyChart) OR A paper copy in the mail If you have any lab test that is abnormal or we need to change your treatment, we will call you to review the results.   Follow-Up: At [facility], you and your health needs are our priority.  As part of our continuing mission to provide you with exceptional heart care, we have created designated Provider Care Teams.  These Care Teams include your primary Cardiologist (physician) and Advanced Practice Providers (APPs -  Physician Assistants and Nurse Practitioners) who all work together to provide you with the care you need, when you need it.  We recommend signing up for the patient portal called "MyChart".  Sign up information is provided on this After Visit Summary.  MyChart is used to connect with patients for Virtual Visits (Telemedicine).  Patients are able to view lab/test results, encounter notes, upcoming appointments, etc.  Non-urgent messages can be sent to your provider as well.   To learn more about what you can do with MyChart, go to [email or web address].    Your next appointment:   1 year(s)  Provider:   Verne Carrow, MD     Other Instructions Your provider recommends that you maintain 150 minutes per week of moderate aerobic activity.

## 2022-10-27 LAB — CBC
Hematocrit: 47 % (ref 37.5–51.0)
Hemoglobin: 16.2 g/dL (ref 13.0–17.7)
MCH: 31 pg (ref 26.6–33.0)
MCHC: 34.5 g/dL (ref 31.5–35.7)
MCV: 90 fL (ref 79–97)
Platelets: 314 10*3/uL (ref 150–450)
RBC: 5.23 x10E6/uL (ref 4.14–5.80)
RDW: 13.1 % (ref 11.6–15.4)
WBC: 6.5 10*3/uL (ref 3.4–10.8)

## 2022-10-27 LAB — BASIC METABOLIC PANEL
BUN/Creatinine Ratio: 11 (ref 10–24)
BUN: 10 mg/dL (ref 8–27)
CO2: 23 mmol/L (ref 20–29)
Calcium: 9.6 mg/dL (ref 8.6–10.2)
Chloride: 102 mmol/L (ref 96–106)
Creatinine, Ser: 0.87 mg/dL (ref 0.76–1.27)
Glucose: 95 mg/dL (ref 70–99)
Potassium: 4.3 mmol/L (ref 3.5–5.2)
Sodium: 139 mmol/L (ref 134–144)
eGFR: 98 mL/min/{1.73_m2} (ref >59)

## 2022-10-27 LAB — LIPID PANEL
Chol/HDL Ratio: 3.4 ratio (ref 0.0–5.0)
Cholesterol, Total: 120 mg/dL (ref 100–199)
HDL: 35 mg/dL — ABNORMAL LOW (ref >39)
LDL Chol Calc (NIH): 64 mg/dL (ref 0–99)
Triglycerides: 111 mg/dL (ref 0–149)
VLDL Cholesterol Cal: 21 mg/dL (ref 5–40)

## 2022-12-07 DIAGNOSIS — L570 Actinic keratosis: Secondary | ICD-10-CM | POA: Diagnosis not present

## 2022-12-07 DIAGNOSIS — L821 Other seborrheic keratosis: Secondary | ICD-10-CM | POA: Diagnosis not present

## 2022-12-07 DIAGNOSIS — D2261 Melanocytic nevi of right upper limb, including shoulder: Secondary | ICD-10-CM | POA: Diagnosis not present

## 2022-12-07 DIAGNOSIS — D492 Neoplasm of unspecified behavior of bone, soft tissue, and skin: Secondary | ICD-10-CM | POA: Diagnosis not present

## 2022-12-19 ENCOUNTER — Other Ambulatory Visit: Payer: Self-pay | Admitting: Cardiovascular Disease

## 2022-12-29 DIAGNOSIS — D485 Neoplasm of uncertain behavior of skin: Secondary | ICD-10-CM | POA: Diagnosis not present

## 2022-12-29 DIAGNOSIS — L905 Scar conditions and fibrosis of skin: Secondary | ICD-10-CM | POA: Diagnosis not present

## 2023-08-01 DIAGNOSIS — L910 Hypertrophic scar: Secondary | ICD-10-CM | POA: Diagnosis not present

## 2023-08-01 DIAGNOSIS — L821 Other seborrheic keratosis: Secondary | ICD-10-CM | POA: Diagnosis not present

## 2023-08-01 DIAGNOSIS — L538 Other specified erythematous conditions: Secondary | ICD-10-CM | POA: Diagnosis not present

## 2023-08-01 DIAGNOSIS — D225 Melanocytic nevi of trunk: Secondary | ICD-10-CM | POA: Diagnosis not present

## 2023-08-01 DIAGNOSIS — L2989 Other pruritus: Secondary | ICD-10-CM | POA: Diagnosis not present

## 2023-08-01 DIAGNOSIS — L820 Inflamed seborrheic keratosis: Secondary | ICD-10-CM | POA: Diagnosis not present

## 2023-08-01 DIAGNOSIS — L905 Scar conditions and fibrosis of skin: Secondary | ICD-10-CM | POA: Diagnosis not present

## 2023-08-01 DIAGNOSIS — L814 Other melanin hyperpigmentation: Secondary | ICD-10-CM | POA: Diagnosis not present

## 2023-09-07 DIAGNOSIS — Z125 Encounter for screening for malignant neoplasm of prostate: Secondary | ICD-10-CM | POA: Diagnosis not present

## 2023-09-07 DIAGNOSIS — I2510 Atherosclerotic heart disease of native coronary artery without angina pectoris: Secondary | ICD-10-CM | POA: Diagnosis not present

## 2023-09-07 DIAGNOSIS — E7800 Pure hypercholesterolemia, unspecified: Secondary | ICD-10-CM | POA: Diagnosis not present

## 2023-09-07 DIAGNOSIS — Z0000 Encounter for general adult medical examination without abnormal findings: Secondary | ICD-10-CM | POA: Diagnosis not present

## 2023-09-07 DIAGNOSIS — I10 Essential (primary) hypertension: Secondary | ICD-10-CM | POA: Diagnosis not present

## 2023-09-07 DIAGNOSIS — K219 Gastro-esophageal reflux disease without esophagitis: Secondary | ICD-10-CM | POA: Diagnosis not present

## 2023-11-02 DIAGNOSIS — I2510 Atherosclerotic heart disease of native coronary artery without angina pectoris: Secondary | ICD-10-CM | POA: Diagnosis not present

## 2023-11-02 DIAGNOSIS — M7022 Olecranon bursitis, left elbow: Secondary | ICD-10-CM | POA: Diagnosis not present

## 2023-12-09 ENCOUNTER — Other Ambulatory Visit: Payer: Self-pay | Admitting: Physician Assistant

## 2023-12-12 DIAGNOSIS — M7022 Olecranon bursitis, left elbow: Secondary | ICD-10-CM | POA: Diagnosis not present

## 2023-12-28 ENCOUNTER — Ambulatory Visit: Admitting: Cardiovascular Disease

## 2024-01-03 ENCOUNTER — Other Ambulatory Visit: Payer: Self-pay | Admitting: Cardiovascular Disease

## 2024-02-29 NOTE — Progress Notes (Unsigned)
 No chief complaint on file.  History of Present Illness: 63 yo male with history of HTN, HLD and CAD who is here today for follow up. He was admitted to [facility] May 2018 with a NSTEMI. Cardiac cath May 2018 with a severe stenosis in the diagonal branch treated with balloon angioplasty. He was also found to have 25% ostial LAD stenosis, 50% mid LAD stenosis, 90% distal Circumflex stenosis (very small caliber vessel). LVEF normal by LV gram. Echo 02/20/17 with normal LV size and function, LVEF=60-65%, no valve disease. Nuclear stress test January 2020 with no ischemia.  He is here today for follow up. The patient denies any chest pain, dyspnea, palpitations, lower extremity edema, orthopnea, PND, dizziness, near syncope or syncope.   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    Past Surgical History:  Procedure Laterality Date   COLONOSCOPY     CORONARY BALLOON ANGIOPLASTY N/A 10/27/2016   Procedure: Coronary Balloon Angioplasty;  Surgeon: Dann Candyce RAMAN, MD;  Location: [facility] INVASIVE CV LAB;  Service: Cardiovascular;  Laterality: N/A;   HERNIA REPAIR     LEFT HEART CATH AND CORONARY ANGIOGRAPHY N/A 10/27/2016   Procedure: Left Heart Cath and Coronary Angiography;  Surgeon: Dann Candyce RAMAN, MD;  Location: [facility] INVASIVE CV LAB;  Service: Cardiovascular;  Laterality: N/A;   TONSILLECTOMY AND ADENOIDECTOMY     UPPER GASTROINTESTINAL ENDOSCOPY      Current Outpatient Medications  Medication Sig Dispense Refill   aspirin  EC 81 MG EC tablet Take 1 tablet (81 mg total) by mouth daily. 1 tablet 0   clopidogrel  (PLAVIX ) 75 MG tablet TAKE 1 TABLET BY  MOUTH EVERY DAY 90 tablet 3   esomeprazole  (NEXIUM ) 40 MG capsule Take 40 mg by mouth daily at 12 noon.     metoprolol  tartrate (LOPRESSOR ) 25 MG tablet TAKE 1 TABLET BY MOUTH TWICE A DAY 180 tablet 0   nitroGLYCERIN  (NITROSTAT ) 0.4 MG SL tablet Place 0.4 mg under the tongue every 5 (five) minutes as needed for chest pain.     rosuvastatin  (CRESTOR ) 20 MG tablet TAKE 1 TABLET BY MOUTH EVERY DAY 90 tablet 0   No current facility-administered medications for this visit.    Allergies  Allergen Reactions   Honey Bee Venom Protein [Honey Bee Venom]     Social History   Socioeconomic History   Marital status: Married    Spouse name: Not on file   Number of children: 3   Years of education: Not on file   Highest education level: Not on file  Occupational History   Occupation: Market researcher: [employer]  Tobacco Use   Smoking status: Never   Smokeless tobacco: Never  Vaping Use   Vaping status: Never Used  Substance and Sexual Activity   Alcohol use: No   Drug use: No   Sexual activity: Not on file  Other Topics Concern   Not on file  Social History Narrative   Lives with wife in [place], near [place].   Social Drivers of Corporate investment banker Strain: Not on  file  Food Insecurity: Not on file  Transportation Needs: Not on file  Physical Activity: Not on file  Stress: Not on file  Social Connections: Not on file  Intimate Partner Violence: Not on file    Family History  Problem Relation Age of Onset   Heart disease Mother        CABG age 62, born 25   Stomach cancer Father 28   Leukemia Maternal Grandmother    Heart attack Maternal Aunt 35       x3   Colon cancer Neg Hx    Diabetes Neg Hx    Kidney disease Neg Hx    Liver disease Neg Hx    Colon polyps Neg Hx    Rectal cancer Neg Hx     Review of Systems:  As stated in the HPI and otherwise negative.   There were no vitals taken for this visit.  Physical Examination:  General:  Well developed, well nourished, NAD  HEENT: OP clear, mucus membranes moist  SKIN: warm, dry. No rashes. Neuro: No focal deficits  Musculoskeletal: Muscle strength 5/5 all ext  Psychiatric: Mood and affect normal  Neck: No JVD, no carotid bruits, no thyromegaly, no lymphadenopathy.  Lungs:Clear bilaterally, no wheezes, rhonci, crackles Cardiovascular: Regular rate and rhythm. No murmurs, gallops or rubs. Abdomen:Soft. Bowel sounds present. Non-tender.  Extremities: No lower extremity edema. Pulses are 2 + in the bilateral DP/PT.  EKG:  EKG is *** ordered today. The ekg ordered today demonstrates   Recent Labs: No results found for requested labs within last 365 days.   Lipid Panel    Component Value Date/Time   CHOL 120 10/26/2022 1151   TRIG 111 10/26/2022 1151   HDL 35 (L) 10/26/2022 1151   CHOLHDL 3.4 10/26/2022 1151   LDLCALC 64 10/26/2022 1151     Wt Readings from Last 3 Encounters:  10/26/22 248 lb (112.5 kg)  03/28/22 242 lb (109.8 kg)  02/09/22 242 lb 3.2 oz (109.9 kg)    Assessment and Plan:   1. CAD without angina: No chest pain. Continue ASA, Plavix , Lopressor  and Crestor .   2. HTN: BP is controlled. Continue current therapy  3. Hyperlipidemia: LDL 64 in 2024. Continue Crestor . *** Check lipids and LFTs today.    Labs/ tests ordered today include:  No orders of the defined types were placed in this encounter.  Disposition:   F/U with me in 12 months  Signed, Lonni Cash, MD 02/29/2024 2:56 PM    [facility] [address] [place], [place], [place]  [address] Phone: [phone]; Fax: [phone]

## 2024-03-01 ENCOUNTER — Ambulatory Visit: Attending: Cardiovascular Disease | Admitting: Cardiovascular Disease

## 2024-03-01 ENCOUNTER — Encounter: Payer: Self-pay | Admitting: Cardiovascular Disease

## 2024-03-01 VITALS — BP 130/84 | HR 66 | Resp 16 | Ht 74.0 in | Wt 250.0 lb

## 2024-03-01 DIAGNOSIS — I2510 Atherosclerotic heart disease of native coronary artery without angina pectoris: Secondary | ICD-10-CM

## 2024-03-01 DIAGNOSIS — I10 Essential (primary) hypertension: Secondary | ICD-10-CM

## 2024-03-01 DIAGNOSIS — E782 Mixed hyperlipidemia: Secondary | ICD-10-CM

## 2024-03-01 NOTE — Patient Instructions (Signed)
 Medication Instructions:  Your physician recommends that you continue on your current medications as directed. Please refer to the Current Medication list given to you today.  *If you need a refill on your cardiac medications before your next appointment, please call your pharmacy* Follow-Up: At [facility], you and your health needs are our priority.  As part of our continuing mission to provide you with exceptional heart care, our providers are all part of one team.  This team includes your primary Cardiologist (physician) and Advanced Practice Providers or APPs (Physician Assistants and Nurse Practitioners) who all work together to provide you with the care you need, when you need it.  Your next appointment:   1 year  Provider:   Lonni Cash, MD

## 2024-03-21 DIAGNOSIS — Z125 Encounter for screening for malignant neoplasm of prostate: Secondary | ICD-10-CM | POA: Diagnosis not present

## 2024-03-22 ENCOUNTER — Ambulatory Visit: Admitting: Cardiovascular Disease

## 2024-06-19 ENCOUNTER — Other Ambulatory Visit: Payer: Self-pay

## 2024-06-19 ENCOUNTER — Encounter (HOSPITAL_BASED_OUTPATIENT_CLINIC_OR_DEPARTMENT_OTHER): Payer: Self-pay

## 2024-06-19 ENCOUNTER — Emergency Department (HOSPITAL_BASED_OUTPATIENT_CLINIC_OR_DEPARTMENT_OTHER)
Admission: EM | Admit: 2024-06-19 | Discharge: 2024-06-19 | Disposition: A | Discharge status: Home / Self Care | Attending: Emergency Medicine | Admitting: Emergency Medicine

## 2024-06-19 DIAGNOSIS — R112 Nausea with vomiting, unspecified: Secondary | ICD-10-CM | POA: Insufficient documentation

## 2024-06-19 DIAGNOSIS — I10 Essential (primary) hypertension: Secondary | ICD-10-CM | POA: Insufficient documentation

## 2024-06-19 DIAGNOSIS — R197 Diarrhea, unspecified: Secondary | ICD-10-CM | POA: Diagnosis not present

## 2024-06-19 DIAGNOSIS — Z7982 Long term (current) use of aspirin: Secondary | ICD-10-CM | POA: Insufficient documentation

## 2024-06-19 DIAGNOSIS — Z79899 Other long term (current) drug therapy: Secondary | ICD-10-CM | POA: Insufficient documentation

## 2024-06-19 LAB — COMPREHENSIVE METABOLIC PANEL WITH GFR
ALT: 11 U/L (ref 0–44)
AST: 14 U/L — ABNORMAL LOW (ref 15–41)
Albumin: 4.4 g/dL (ref 3.5–5.0)
Alkaline Phosphatase: 149 U/L — ABNORMAL HIGH (ref 38–126)
Anion gap: 13 (ref 5–15)
BUN: 22 mg/dL (ref 8–23)
CO2: 27 mmol/L (ref 22–32)
Calcium: 9.9 mg/dL (ref 8.9–10.3)
Chloride: 98 mmol/L (ref 98–111)
Creatinine, Ser: 1.05 mg/dL (ref 0.61–1.24)
GFR, Estimated: >60 mL/min
Glucose, Bld: 109 mg/dL — ABNORMAL HIGH (ref 70–99)
Potassium: 3.6 mmol/L (ref 3.5–5.1)
Sodium: 138 mmol/L (ref 135–145)
Total Bilirubin: 0.7 mg/dL (ref 0.0–1.2)
Total Protein: 7.4 g/dL (ref 6.5–8.1)

## 2024-06-19 LAB — CBC
HCT: 53.6 % — ABNORMAL HIGH (ref 39.0–52.0)
Hemoglobin: 19.2 g/dL — ABNORMAL HIGH (ref 13.0–17.0)
MCH: 31.3 pg (ref 26.0–34.0)
MCHC: 35.8 g/dL (ref 30.0–36.0)
MCV: 87.4 fL (ref 80.0–100.0)
Platelets: 363 K/uL (ref 150–400)
RBC: 6.13 MIL/uL — ABNORMAL HIGH (ref 4.22–5.81)
RDW: 14.1 % (ref 11.5–15.5)
WBC: 13.7 K/uL — ABNORMAL HIGH (ref 4.0–10.5)
nRBC: 0 % (ref 0.0–0.2)

## 2024-06-19 LAB — LIPASE, BLOOD: Lipase: 30 U/L (ref 11–51)

## 2024-06-19 MED ORDER — ONDANSETRON 8 MG PO TBDP: ORAL_TABLET | ORAL | 1 refills | Status: AC

## 2024-06-19 MED ORDER — SODIUM CHLORIDE 0.9 % IV BOLUS
1000.0000 mL | Freq: Once | INTRAVENOUS | Status: AC
  Administered 2024-06-19: 1000 mL via INTRAVENOUS

## 2024-06-19 MED ORDER — KETOROLAC TROMETHAMINE 30 MG/ML IJ SOLN
30.0000 mg | Freq: Once | INTRAMUSCULAR | Status: AC
  Administered 2024-06-19: 30 mg via INTRAVENOUS
  Filled 2024-06-19: qty 1

## 2024-06-19 MED ORDER — ONDANSETRON HCL 4 MG/2ML IJ SOLN
4.0000 mg | Freq: Once | INTRAMUSCULAR | Status: AC
  Administered 2024-06-19: 4 mg via INTRAVENOUS
  Filled 2024-06-19: qty 2

## 2024-06-19 NOTE — Discharge Instructions (Addendum)
 Begin taking Zofran  as prescribed as needed for nausea.  Clear liquid diet for the next 12 hours, then slowly advance as tolerated.  Return to the emergency department if you develop severe abdominal pain, bloody stool or vomit, high fevers, or for other new and concerning symptoms.

## 2024-06-19 NOTE — ED Triage Notes (Signed)
 Pt presents via POV c/o N/V/D since Sunday. Denies abd pain. Reports recently increased dose of GLP1.

## 2024-06-19 NOTE — ED Provider Notes (Signed)
 " [place] EMERGENCY DEPARTMENT AT [facility] Provider Note   CSN: [id number] Arrival date & time: 06/19/24  9562     Patient presents with: Emesis   Alexander [name] is a 64 y.o. male.   Patient is a 64 year old male with history of hypertension.  Patient presenting today with complaints of nausea, vomiting, diarrhea.  Symptoms have been ongoing for the past 4 days.  He states he has had very little oral intake during this period of time.  He denies any abdominal pain or bloody stool or vomit.  No fevers or chills.  He does report an increase in his dose of Wegovy 2 days prior to onset of symptoms.       Prior to Admission medications  Medication Sig Start Date End Date Taking? Authorizing Provider  aspirin  EC 81 MG EC tablet Take 1 tablet (81 mg total) by mouth daily. 10/29/16   Milissa Tod PARAS, MD  clopidogrel  (PLAVIX ) 75 MG tablet TAKE 1 TABLET BY MOUTH EVERY DAY 12/19/22   Parthenia Olivia HERO, PA-C  esomeprazole  (NEXIUM ) 40 MG capsule Take 40 mg by mouth daily at 12 noon.    [provider]  metoprolol  tartrate (LOPRESSOR ) 25 MG tablet TAKE 1 TABLET BY MOUTH TWICE A DAY 01/03/24   Verlin Lonni BIRCH, MD  nitroGLYCERIN  (NITROSTAT ) 0.4 MG SL tablet Place 0.4 mg under the tongue every 5 (five) minutes as needed for chest pain.    [provider]  rosuvastatin  (CRESTOR ) 20 MG tablet TAKE 1 TABLET BY MOUTH EVERY DAY 01/03/24   Verlin Lonni BIRCH, MD    Allergies: Honey bee venom protein [honey bee venom]    Review of Systems  All other systems reviewed and are negative.   Updated Vital Signs BP (!) 138/102   Pulse 87   Temp 98.1 F (36.7 C) (Oral)   Resp 16   SpO2 95%   Physical Exam Vitals and nursing note reviewed.  Constitutional:      General: He is not in acute distress.    Appearance: He is well-developed. He is not diaphoretic.  HENT:     Head: Normocephalic and atraumatic.  Cardiovascular:     Rate and Rhythm: Normal rate and regular  rhythm.     Heart sounds: No murmur heard.    No friction rub.  Pulmonary:     Effort: Pulmonary effort is normal. No respiratory distress.     Breath sounds: Normal breath sounds. No wheezing or rales.  Abdominal:     General: Bowel sounds are normal. There is no distension.     Palpations: Abdomen is soft.     Tenderness: There is no abdominal tenderness.  Musculoskeletal:        General: Normal range of motion.     Cervical back: Normal range of motion and neck supple.  Skin:    General: Skin is warm and dry.  Neurological:     Mental Status: He is alert and oriented to person, place, and time.     Coordination: Coordination normal.     (all labs ordered are listed, but only abnormal results are displayed) Labs Reviewed  CBC - Abnormal; Notable for the following components:      Result Value   WBC 13.7 (*)    RBC 6.13 (*)    Hemoglobin 19.2 (*)    HCT 53.6 (*)    All other components within normal limits  LIPASE, BLOOD  COMPREHENSIVE METABOLIC PANEL WITH GFR  URINALYSIS, ROUTINE  W REFLEX MICROSCOPIC    EKG: None  Radiology: No results found.   Procedures   Medications Ordered in the ED  sodium chloride  0.9 % bolus 1,000 mL (has no administration in time range)  ondansetron  (ZOFRAN ) injection 4 mg (has no administration in time range)  ketorolac  (TORADOL ) 30 MG/ML injection 30 mg (has no administration in time range)                                    Medical Decision Making Amount and/or Complexity of Data Reviewed Labs: ordered.  Risk Prescription drug management.   Patient is a 64 year old male presenting with complaints of nausea, vomiting, and diarrhea.  Symptoms began 4 days ago and 2 days after his dose of Wegovy was increased.  Patient arrives here with stable vital signs and is afebrile.  Physical examination reveals a benign abdomen and patient appears well-hydrated.  Laboratory studies obtained including CBC, CMP, and lipase.  White count is  13.7, but laboratory studies otherwise unremarkable.  There is no elevation of liver or pancreatic enzymes and there is no significant electrolyte derangement.  Patient hydrated with normal saline and given Zofran  for nausea and Toradol  for cramping.  He began to experience additional nausea, so was given more Zofran .  I suspect his symptoms are related to his dose of Wegovy being increased.  His abdomen is benign with no tenderness, so I do not feel as though any imaging is indicated.  Presentation and workup inconsistent with cholecystitis, appendicitis, or other acute intra-abdominal pathology.  I feel as though he can safely be discharged with Zofran  and as needed return.     Final diagnoses:  None    ED Discharge Orders     None          Geroldine Berg, MD 06/19/24 [phone]  "
